# Patient Record
Sex: Female | Born: 1975
Health system: Southern US, Community
[De-identification: ages and names within clinical notes are randomized; demographics above are authoritative.]

## PROBLEM LIST (undated history)

## (undated) DIAGNOSIS — E7800 Pure hypercholesterolemia, unspecified: Secondary | ICD-10-CM

## (undated) DIAGNOSIS — E119 Type 2 diabetes mellitus without complications: Secondary | ICD-10-CM

## (undated) DIAGNOSIS — I10 Essential (primary) hypertension: Secondary | ICD-10-CM

## (undated) HISTORY — PX: TONSILLECTOMY: SUR1361

---

## 1997-03-05 ENCOUNTER — Inpatient Hospital Stay (HOSPITAL_COMMUNITY): Admission: AD | Admit: 1997-03-05 | Discharge: 1997-03-06 | Payer: Self-pay | Admitting: *Deleted

## 1997-06-10 ENCOUNTER — Inpatient Hospital Stay (HOSPITAL_COMMUNITY): Admission: AD | Admit: 1997-06-10 | Discharge: 1997-06-10 | Payer: Self-pay | Admitting: Obstetrics

## 1997-06-17 ENCOUNTER — Other Ambulatory Visit: Admission: RE | Admit: 1997-06-17 | Discharge: 1997-06-17 | Payer: Self-pay | Admitting: Family Medicine

## 1999-07-15 ENCOUNTER — Emergency Department (HOSPITAL_COMMUNITY): Admission: EM | Admit: 1999-07-15 | Discharge: 1999-07-15 | Payer: Self-pay | Admitting: Emergency Medicine

## 1999-07-15 ENCOUNTER — Encounter: Payer: Self-pay | Admitting: Emergency Medicine

## 1999-07-20 ENCOUNTER — Encounter: Admission: RE | Admit: 1999-07-20 | Discharge: 1999-07-29 | Payer: Self-pay | Admitting: Family Medicine

## 2000-06-03 ENCOUNTER — Emergency Department (HOSPITAL_COMMUNITY): Admission: EM | Admit: 2000-06-03 | Discharge: 2000-06-03 | Payer: Self-pay | Admitting: Emergency Medicine

## 2000-09-02 ENCOUNTER — Inpatient Hospital Stay (HOSPITAL_COMMUNITY): Admission: AD | Admit: 2000-09-02 | Discharge: 2000-09-02 | Payer: Self-pay | Admitting: *Deleted

## 2000-12-05 ENCOUNTER — Encounter: Admission: RE | Admit: 2000-12-05 | Discharge: 2001-03-05 | Payer: Self-pay | Admitting: Internal Medicine

## 2001-05-29 ENCOUNTER — Emergency Department (HOSPITAL_COMMUNITY): Admission: EM | Admit: 2001-05-29 | Discharge: 2001-05-29 | Payer: Self-pay

## 2001-07-05 ENCOUNTER — Emergency Department (HOSPITAL_COMMUNITY): Admission: EM | Admit: 2001-07-05 | Discharge: 2001-07-05 | Payer: Self-pay | Admitting: Emergency Medicine

## 2001-07-05 ENCOUNTER — Encounter: Payer: Self-pay | Admitting: Emergency Medicine

## 2001-10-10 ENCOUNTER — Inpatient Hospital Stay (HOSPITAL_COMMUNITY): Admission: AD | Admit: 2001-10-10 | Discharge: 2001-10-10 | Payer: Self-pay | Admitting: Obstetrics and Gynecology

## 2001-10-13 ENCOUNTER — Inpatient Hospital Stay (HOSPITAL_COMMUNITY): Admission: AD | Admit: 2001-10-13 | Discharge: 2001-10-13 | Payer: Self-pay | Admitting: Obstetrics and Gynecology

## 2002-02-24 ENCOUNTER — Inpatient Hospital Stay (HOSPITAL_COMMUNITY): Admission: AD | Admit: 2002-02-24 | Discharge: 2002-02-24 | Payer: Self-pay | Admitting: Obstetrics

## 2002-08-18 ENCOUNTER — Encounter: Admission: RE | Admit: 2002-08-18 | Discharge: 2002-11-16 | Payer: Self-pay | Admitting: Internal Medicine

## 2002-10-27 ENCOUNTER — Inpatient Hospital Stay (HOSPITAL_COMMUNITY): Admission: AD | Admit: 2002-10-27 | Discharge: 2002-10-27 | Payer: Self-pay | Admitting: Obstetrics & Gynecology

## 2003-02-16 ENCOUNTER — Encounter: Admission: RE | Admit: 2003-02-16 | Discharge: 2003-02-16 | Payer: Self-pay | Admitting: Obstetrics

## 2003-02-20 ENCOUNTER — Encounter: Admission: RE | Admit: 2003-02-20 | Discharge: 2003-02-20 | Payer: Self-pay | Admitting: Obstetrics

## 2003-02-20 ENCOUNTER — Ambulatory Visit (HOSPITAL_COMMUNITY): Admission: RE | Admit: 2003-02-20 | Discharge: 2003-02-20 | Payer: Self-pay | Admitting: Obstetrics

## 2003-02-24 ENCOUNTER — Encounter: Admission: RE | Admit: 2003-02-24 | Discharge: 2003-02-24 | Payer: Self-pay | Admitting: Obstetrics

## 2003-02-27 ENCOUNTER — Encounter: Admission: RE | Admit: 2003-02-27 | Discharge: 2003-02-27 | Payer: Self-pay | Admitting: Obstetrics

## 2003-03-02 ENCOUNTER — Ambulatory Visit (HOSPITAL_COMMUNITY): Admission: RE | Admit: 2003-03-02 | Discharge: 2003-03-02 | Payer: Self-pay | Admitting: Obstetrics

## 2003-03-02 ENCOUNTER — Inpatient Hospital Stay (HOSPITAL_COMMUNITY): Admission: AD | Admit: 2003-03-02 | Discharge: 2003-03-06 | Payer: Self-pay | Admitting: Obstetrics

## 2003-03-02 ENCOUNTER — Encounter: Admission: RE | Admit: 2003-03-02 | Discharge: 2003-03-02 | Payer: Self-pay | Admitting: Obstetrics

## 2003-03-03 ENCOUNTER — Encounter (INDEPENDENT_AMBULATORY_CARE_PROVIDER_SITE_OTHER): Payer: Self-pay | Admitting: Specialist

## 2004-09-18 ENCOUNTER — Emergency Department (HOSPITAL_COMMUNITY): Admission: EM | Admit: 2004-09-18 | Discharge: 2004-09-18 | Payer: Self-pay | Admitting: Family Medicine

## 2005-08-06 ENCOUNTER — Inpatient Hospital Stay (HOSPITAL_COMMUNITY): Admission: AD | Admit: 2005-08-06 | Discharge: 2005-08-06 | Payer: Self-pay | Admitting: Obstetrics

## 2005-09-16 ENCOUNTER — Inpatient Hospital Stay (HOSPITAL_COMMUNITY): Admission: AD | Admit: 2005-09-16 | Discharge: 2005-09-16 | Payer: Self-pay | Admitting: Obstetrics

## 2006-01-23 HISTORY — PX: APPENDECTOMY: SHX54

## 2006-12-17 ENCOUNTER — Encounter (INDEPENDENT_AMBULATORY_CARE_PROVIDER_SITE_OTHER): Payer: Self-pay | Admitting: Surgery

## 2006-12-17 ENCOUNTER — Inpatient Hospital Stay (HOSPITAL_COMMUNITY): Admission: EM | Admit: 2006-12-17 | Discharge: 2006-12-18 | Payer: Self-pay | Admitting: Emergency Medicine

## 2007-01-11 ENCOUNTER — Emergency Department (HOSPITAL_COMMUNITY): Admission: EM | Admit: 2007-01-11 | Discharge: 2007-01-11 | Payer: Self-pay | Admitting: Emergency Medicine

## 2008-07-28 ENCOUNTER — Emergency Department (HOSPITAL_COMMUNITY): Admission: EM | Admit: 2008-07-28 | Discharge: 2008-07-28 | Payer: Self-pay | Admitting: Family Medicine

## 2008-08-25 ENCOUNTER — Inpatient Hospital Stay (HOSPITAL_COMMUNITY): Admission: AD | Admit: 2008-08-25 | Discharge: 2008-08-26 | Payer: Self-pay | Admitting: Obstetrics

## 2009-01-07 ENCOUNTER — Encounter: Admission: RE | Admit: 2009-01-07 | Discharge: 2009-01-20 | Payer: Self-pay | Admitting: Obstetrics

## 2009-01-07 ENCOUNTER — Ambulatory Visit (HOSPITAL_COMMUNITY): Admission: RE | Admit: 2009-01-07 | Discharge: 2009-01-07 | Payer: Self-pay | Admitting: Obstetrics

## 2009-01-12 ENCOUNTER — Inpatient Hospital Stay (HOSPITAL_COMMUNITY): Admission: AD | Admit: 2009-01-12 | Discharge: 2009-01-12 | Payer: Self-pay | Admitting: Obstetrics

## 2009-01-28 ENCOUNTER — Ambulatory Visit (HOSPITAL_COMMUNITY): Admission: RE | Admit: 2009-01-28 | Discharge: 2009-01-28 | Payer: Self-pay | Admitting: Obstetrics

## 2009-03-11 ENCOUNTER — Ambulatory Visit (HOSPITAL_COMMUNITY): Admission: RE | Admit: 2009-03-11 | Discharge: 2009-03-11 | Payer: Self-pay | Admitting: Obstetrics

## 2009-04-08 ENCOUNTER — Ambulatory Visit (HOSPITAL_COMMUNITY): Admission: RE | Admit: 2009-04-08 | Discharge: 2009-04-08 | Payer: Self-pay | Admitting: Obstetrics

## 2009-05-06 ENCOUNTER — Ambulatory Visit (HOSPITAL_COMMUNITY): Admission: RE | Admit: 2009-05-06 | Discharge: 2009-05-06 | Payer: Self-pay | Admitting: Obstetrics

## 2009-06-03 ENCOUNTER — Ambulatory Visit (HOSPITAL_COMMUNITY): Admission: RE | Admit: 2009-06-03 | Discharge: 2009-06-03 | Payer: Self-pay | Admitting: Obstetrics

## 2009-06-10 ENCOUNTER — Ambulatory Visit (HOSPITAL_COMMUNITY): Admission: RE | Admit: 2009-06-10 | Discharge: 2009-06-10 | Payer: Self-pay | Admitting: Obstetrics

## 2009-06-17 ENCOUNTER — Ambulatory Visit (HOSPITAL_COMMUNITY): Admission: RE | Admit: 2009-06-17 | Discharge: 2009-06-17 | Payer: Self-pay | Admitting: Obstetrics

## 2009-06-21 ENCOUNTER — Inpatient Hospital Stay (HOSPITAL_COMMUNITY): Admission: AD | Admit: 2009-06-21 | Discharge: 2009-06-21 | Payer: Self-pay | Admitting: Obstetrics

## 2009-06-24 ENCOUNTER — Ambulatory Visit (HOSPITAL_COMMUNITY): Admission: RE | Admit: 2009-06-24 | Discharge: 2009-06-24 | Payer: Self-pay | Admitting: Obstetrics

## 2009-06-28 ENCOUNTER — Ambulatory Visit (HOSPITAL_COMMUNITY): Admission: RE | Admit: 2009-06-28 | Discharge: 2009-06-28 | Payer: Self-pay | Admitting: Obstetrics

## 2009-07-01 ENCOUNTER — Ambulatory Visit (HOSPITAL_COMMUNITY): Admission: RE | Admit: 2009-07-01 | Discharge: 2009-07-01 | Payer: Self-pay | Admitting: Obstetrics

## 2009-07-05 ENCOUNTER — Ambulatory Visit (HOSPITAL_COMMUNITY): Admission: RE | Admit: 2009-07-05 | Discharge: 2009-07-05 | Payer: Self-pay | Admitting: Obstetrics

## 2009-07-08 ENCOUNTER — Ambulatory Visit (HOSPITAL_COMMUNITY): Admission: RE | Admit: 2009-07-08 | Discharge: 2009-07-08 | Payer: Self-pay | Admitting: Obstetrics

## 2009-07-12 ENCOUNTER — Ambulatory Visit (HOSPITAL_COMMUNITY): Admission: RE | Admit: 2009-07-12 | Discharge: 2009-07-12 | Payer: Self-pay | Admitting: Obstetrics

## 2009-07-15 ENCOUNTER — Ambulatory Visit (HOSPITAL_COMMUNITY): Admission: RE | Admit: 2009-07-15 | Discharge: 2009-07-15 | Payer: Self-pay | Admitting: Obstetrics

## 2009-07-19 ENCOUNTER — Ambulatory Visit (HOSPITAL_COMMUNITY): Admission: RE | Admit: 2009-07-19 | Discharge: 2009-07-19 | Payer: Self-pay | Admitting: Obstetrics

## 2009-07-22 ENCOUNTER — Ambulatory Visit (HOSPITAL_COMMUNITY): Admission: RE | Admit: 2009-07-22 | Discharge: 2009-07-22 | Payer: Self-pay | Admitting: Obstetrics

## 2009-07-28 ENCOUNTER — Inpatient Hospital Stay (HOSPITAL_COMMUNITY): Admission: RE | Admit: 2009-07-28 | Discharge: 2009-07-31 | Payer: Self-pay | Admitting: Obstetrics

## 2009-08-19 ENCOUNTER — Emergency Department (HOSPITAL_COMMUNITY): Admission: EM | Admit: 2009-08-19 | Discharge: 2009-08-19 | Payer: Self-pay | Admitting: Emergency Medicine

## 2010-04-10 LAB — RPR: RPR Ser Ql: NONREACTIVE

## 2010-04-10 LAB — CBC
HCT: 27.9 % — ABNORMAL LOW (ref 36.0–46.0)
HCT: 35.7 % — ABNORMAL LOW (ref 36.0–46.0)
Hemoglobin: 9.4 g/dL — ABNORMAL LOW (ref 12.0–15.0)
Hemoglobin: 11.9 g/dL — ABNORMAL LOW (ref 12.0–15.0)
MCHC: 33.2 g/dL (ref 30.0–36.0)
MCV: 83 fL (ref 78.0–100.0)
RBC: 3.36 MIL/uL — ABNORMAL LOW (ref 3.87–5.11)
RDW: 15.2 % (ref 11.5–15.5)
WBC: 16 10*3/uL — ABNORMAL HIGH (ref 4.0–10.5)
WBC: 10.8 10*3/uL — ABNORMAL HIGH (ref 4.0–10.5)

## 2010-04-10 LAB — BASIC METABOLIC PANEL
BUN: 6 mg/dL (ref 6–23)
CO2: 23 mEq/L (ref 19–32)
Chloride: 107 mEq/L (ref 96–112)
Potassium: 3.9 mEq/L (ref 3.5–5.1)

## 2010-04-10 LAB — GLUCOSE, CAPILLARY
Glucose-Capillary: 109 mg/dL — ABNORMAL HIGH (ref 70–99)
Glucose-Capillary: 174 mg/dL — ABNORMAL HIGH (ref 70–99)
Glucose-Capillary: 67 mg/dL — ABNORMAL LOW (ref 70–99)
Glucose-Capillary: 70 mg/dL (ref 70–99)
Glucose-Capillary: 78 mg/dL (ref 70–99)
Glucose-Capillary: 79 mg/dL (ref 70–99)
Glucose-Capillary: 88 mg/dL (ref 70–99)
Glucose-Capillary: 91 mg/dL (ref 70–99)
Glucose-Capillary: 92 mg/dL (ref 70–99)

## 2010-04-10 LAB — SURGICAL PCR SCREEN: MRSA, PCR: NEGATIVE

## 2010-04-25 LAB — CBC
HCT: 37.9 % (ref 36.0–46.0)
Hemoglobin: 12.4 g/dL (ref 12.0–15.0)
MCHC: 32.7 g/dL (ref 30.0–36.0)
RDW: 13.3 % (ref 11.5–15.5)

## 2010-04-25 LAB — URINALYSIS, ROUTINE W REFLEX MICROSCOPIC
Glucose, UA: >1000 mg/dL — AB
Ketones, ur: NEGATIVE mg/dL
pH: 6 (ref 5.0–8.0)

## 2010-04-25 LAB — GC/CHLAMYDIA PROBE AMP, GENITAL
Chlamydia, DNA Probe: NEGATIVE
GC Probe Amp, Genital: NEGATIVE

## 2010-04-25 LAB — COMPREHENSIVE METABOLIC PANEL
Alkaline Phosphatase: 48 U/L (ref 39–117)
BUN: 6 mg/dL (ref 6–23)
Calcium: 9.4 mg/dL (ref 8.4–10.5)
Creatinine, Ser: 0.56 mg/dL (ref 0.4–1.2)
Glucose, Bld: 134 mg/dL — ABNORMAL HIGH (ref 70–99)
Total Protein: 6.6 g/dL (ref 6.0–8.3)

## 2010-04-25 LAB — URINE MICROSCOPIC-ADD ON

## 2010-04-30 LAB — URINE CULTURE: Colony Count: NO GROWTH

## 2010-04-30 LAB — COMPREHENSIVE METABOLIC PANEL
BUN: 9 mg/dL (ref 6–23)
Calcium: 9.5 mg/dL (ref 8.4–10.5)
Creatinine, Ser: 0.8 mg/dL (ref 0.4–1.2)
Glucose, Bld: 161 mg/dL — ABNORMAL HIGH (ref 70–99)
Sodium: 137 mEq/L (ref 135–145)
Total Protein: 7.1 g/dL (ref 6.0–8.3)

## 2010-04-30 LAB — URINALYSIS, ROUTINE W REFLEX MICROSCOPIC
Glucose, UA: 250 mg/dL — AB
Protein, ur: NEGATIVE mg/dL
pH: 5.5 (ref 5.0–8.0)

## 2010-04-30 LAB — WET PREP, GENITAL
Trich, Wet Prep: NONE SEEN
Yeast Wet Prep HPF POC: NONE SEEN

## 2010-04-30 LAB — CBC
HCT: 40.7 % (ref 36.0–46.0)
Hemoglobin: 13.5 g/dL (ref 12.0–15.0)
MCHC: 33.1 g/dL (ref 30.0–36.0)
MCV: 86.7 fL (ref 78.0–100.0)
RDW: 13.3 % (ref 11.5–15.5)

## 2010-04-30 LAB — GC/CHLAMYDIA PROBE AMP, GENITAL: GC Probe Amp, Genital: NEGATIVE

## 2010-04-30 LAB — URINE MICROSCOPIC-ADD ON

## 2010-06-07 NOTE — Op Note (Signed)
Traci Butler, Traci Butler              ACCOUNT NO.:  [id number]   MEDICAL RECORD NO.:  [id number]          PATIENT TYPE:  INP   LOCATION:  1610                         FACILITY:  [facility]   PHYSICIAN:  Velora Heckler, MD      DATE OF BIRTH:  Sep 26, 1975   DATE OF PROCEDURE:  12/17/2006  DATE OF DISCHARGE:                               OPERATIVE REPORT   PREOPERATIVE DIAGNOSIS:  Acute appendicitis.   POSTOPERATIVE DIAGNOSIS:  Acute appendicitis.   PROCEDURE:  Laparoscopic appendectomy.   SURGEON:  Velora Heckler, MD, FACS   ANESTHESIA:  General per Dr. Lestine Box.   ESTIMATED BLOOD LOSS:  Minimal.   PREPARATION:  Betadine.   COMPLICATIONS:  None.   INDICATIONS:  The patient is a 35 year old black female presents to the  emergency department with a 24-hour history of abdominal pain, diarrhea,  and nausea.  White blood cell count is elevated at 19,100 with a left  shift.  CT scan abdomen and pelvis demonstrated findings consistent with  acute appendicitis.  The patient is seen and evaluated in the emergency  department and prepared for the operating room.   BODY OF REPORT:  Procedure was done in OR #1 at the [facility].  The patient is brought to the operating room,  placed in supine position on the operating room table.  Following  administration of general anesthesia, the patient is prepped and draped  in usual strict aseptic fashion.  After ascertaining that an adequate  level of anesthesia been obtained, a supraumbilical incision was made a  #15 blade.  Dissection was carried down to the fascia.  Fascia is  incised in the midline.  The peritoneal cavity is entered cautiously.  0  Vicryl pursestring suture was placed in the fascia.  A Hasson cannula  was introduced under direct vision and secured with a pursestring  suture.  Abdomen was insufflated carbon dioxide.  Laparoscope was  introduced in the abdomen explored.  There are moderate adhesions in the  mid and lower abdomen.  Operative port is placed in the right upper  quadrant.  Using the harmonic scalpel, adhesions were taken down.  A  second operative port was placed in the left lower quadrant.  After  taking down adhesions, the cecum was identified.  Base of the appendix  was identified.  The appendix was then mobilized from its  retroperitoneal adhesions.  Appendix is quite large, distended but no  sign of gross perforation.  Mesoappendix was divided with the harmonic  scalpel with good hemostasis achieved.  Dissection is carried down to  the base of the appendix which is cleared circumferentially.  Using an  Endo-GIA stapler with a regular cartridge, the base of the appendix is  transected at its junction with the cecal wall.  Appendix is placed into  an EndoCatch bag.  Right lower quadrant was irrigated with warm saline.  Good hemostasis is noted at the staple line and along the divided  mesoappendix.  Fluid is aspirated from the pelvis.  Appendix was  withdrawn through the umbilical port with removal of the  Hasson cannula.  0 Vicryl pursestring sutures tied securely.  Other ports were removed  under direct vision and good hemostasis was noted.  Port sites were  anesthetized with local anesthetic.  Skin incisions were closed with interrupted 4-0 Vicryl subcuticular  sutures.  Wounds washed and dried and Benzoin and Steri-Strips were  applied.  Sterile dressings were applied.  The patient is awakened from  anesthesia and brought to the recovery room in stable condition.  The  patient tolerated the procedure well.      Velora Heckler, MD  Electronically Signed     TMG/MEDQ  D:  12/17/2006  T:  12/17/2006  Job:  [id number]   cc:   Dione Booze, MD  [address] [place], [place] [address]

## 2010-06-07 NOTE — H&P (Signed)
Traci Butler, Traci Butler NO.:  [id number]   MEDICAL RECORD NO.:  [id number]          PATIENT TYPE:  EMS   LOCATION:  ED                           FACILITY:  [facility]   PHYSICIAN:  Velora Heckler, MD      DATE OF BIRTH:  08-21-1975   DATE OF ADMISSION:  12/17/2006  DATE OF DISCHARGE:                              HISTORY & PHYSICAL   REFERRING PHYSICIAN:  Dr. Preston Fleeting, Emergency Department, [facility].   CHIEF COMPLAINT:  Abdominal pain, diarrhea.   HISTORY OF PRESENT ILLNESS:  The patient is a 35 year old black female  who presents to the emergency department with a 24-hour history of  abdominal pain which is poorly localized.  She has had an episode of  diarrhea.  She has had nausea.  In the emergency department, laboratory  studies showed an elevated white blood cell count of 19,100.  There is a  left shift.  CT scan of abdomen and pelvis was obtained with findings  consistent with acute appendicitis.  General Surgery is called for  management.   PAST MEDICAL HISTORY:  1. History of cesarean section.  2. Status post umbilical herniorrhaphy.  3. History of diabetes mellitus, untreated.   MEDICATIONS:  None.   ALLERGIES:  None known.   SOCIAL HISTORY:  The patient is single.  She has 2 children.  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  She smokes less than a pack of  cigarettes a day.  She drinks alcohol on occasion.  She is accompanied  by her mother.   FAMILY HISTORY:  Negative per the patient's mother.   REVIEW OF SYSTEMS:  Fifteen system review without significant other  findings except as noted above.   EXAM:  GENERAL:  A 35 year old moderately obese black female in mild  discomfort on a stretcher in the emergency department.  VITAL SIGNS:  Temperature 97.7, pulse 70, respirations 20, blood  pressure 130/80.  HEENT:  Shows her to be normocephalic, atraumatic.  Sclerae are clear.  Conjunctiva clear.  Pupils are equal and  reactive.  Dentition fair.  Mucous membranes moist.  Voice normal.  NECK:  Palpation of the neck shows no thyroid nodularity, no  lymphadenopathy, no tenderness, no mass.  LUNGS:  Clear to auscultation bilaterally without rales, rhonchi or  wheeze.  CARDIAC:  Exam shows regular rate and rhythm without murmur.  Peripheral  pulses are full.  EXTREMITIES:  Nontender without edema.  ABDOMEN:  Examination of the abdomen shows it to be soft, obese with a  well-healed surgical wound at the base of the umbilicus.  There are  bowel sounds present.  There is tenderness to palpation, particularly in  the right lower quadrant.  There is mild guarding.  There is mild  rebound tenderness in the right lower quadrant.  There is a well-healed  Pfannenstiel incision.  NEUROLOGICAL:  The patient is alert and  oriented.  There is no sign of tremor.   LABORATORY STUDIES:  White count 19.1, hemoglobin 13.4, hematocrit  40.3%, platelet count of 483,000; differential shows 79% neutrophils,  17% lymphocytes, 3% monocytes.  Electrolytes are normal.  Glucose  elevated at 185.  Urine pregnancy test negative.  Urinalysis positive  for nitrite with 7-10 white cells per high-powered field and many  bacteria consistent with urinary tract infection.   RADIOGRAPHIC STUDIES:  CT scan of abdomen and pelvis showing findings  consistent with acute appendicitis.   IMPRESSION:  Acute appendicitis.   PLAN:  1. Admission to [facility].  2. Initiation of intravenous antibiotics.  3. To operating room for laparoscopic appendectomy.  4. Routine postoperative care.   I discussed with the patient and her mother the indications for surgery.  I explained laparoscopic appendectomy.  I explained that we would  attempt to remove the appendix through 3 separate small incisions.  I  explained that she would need to remain in the hospital for intravenous  antibiotics and pain control postoperatively for 1-2  days.  She  understands and wishes to proceed.  We will make arrangements for that  with the operating room for surgery immediately.      Velora Heckler, MD  Electronically Signed     TMG/MEDQ  D:  12/17/2006  T:  12/17/2006  Job:  [id number] [id number]

## 2010-06-10 NOTE — Op Note (Signed)
Traci Butler, Traci Butler                          ACCOUNT NO.:  [id number]   MEDICAL RECORD NO.:  [id number]                   PATIENT TYPE:  INP   LOCATION:  9118                                 FACILITY:  [facility]   PHYSICIAN:  Kathreen Cosier, M.D.           DATE OF BIRTH:  1975-09-06   DATE OF PROCEDURE:  03/03/2003  DATE OF DISCHARGE:                                 OPERATIVE REPORT   PREOPERATIVE DIAGNOSIS:  Intrauterine pregnancy at 38 weeks in a diabetic  with a mature fetus and nonreassuring fetal heart rate tracing.   SURGEON:  Kathreen Cosier, M.D.   FIRST ASSISTANT:  Charles A. Clearance Coots, M.D.   ANESTHESIA:  Spinal.   DESCRIPTION OF PROCEDURE:  Patient placed on the operating room table in  supine position after the spinal anesthesia, the abdomen prepped and draped,  bladder emptied with a Foley catheter.  A transverse suprapubic incision  made, carried down to the rectus fascia, and the fascia cleaned and incised  the length of the incision, the recti muscles retracted laterally,  peritoneum incised longitudinally.  A transverse incision made in the  visceral peritoneum above the bladder and the bladder mobilized inferiorly.  A transverse lower uterine incision was made with the patient and delivered  with a vacuum from the OP position of a female, Apgar 9 and 9, weighing 6  pounds 7 ounces.  The fluid was clear, and the team was in attendance.  The  placenta was anterior and removed manually.  The uterine cavity cleaned with  dry laps.  The uterine incision closed in one layer with continuous suture  of #1 chromic.  Hemostasis was satisfactory.  The bladder flap reattached  with 2-0 chromic.  The uterus well-contracted, tubes and ovaries normal.  Abdomen closed in layers, the peritoneum with a continuous suture of 0  chromic, fascia with continuous suture of 0 Dexon.  Skin closed with  subcuticular stitch of 3-0 Monocryl.  Blood loss was 400 mL.                             Kathreen Cosier, M.D.    BAM/MEDQ  D:  03/03/2003  T:  03/03/2003  Job:  [id number]

## 2010-06-10 NOTE — H&P (Signed)
[name], Traci Butler                          ACCOUNT NO.:  [id number]   MEDICAL RECORD NO.:  [id number]                   PATIENT TYPE:  INP   LOCATION:  9118                                 FACILITY:  [facility]   PHYSICIAN:  Kathreen Cosier, M.D.           DATE OF BIRTH:  [birth year]   DATE OF ADMISSION:  03/02/2003  DATE OF DISCHARGE:                                HISTORY & PHYSICAL   REASON FOR ADMISSION:  The patient is a 35 year old gravida 3, para 1-0-1-1,  [facility] March 15, 2003.  She is now at 38 weeks.  She is a gestational  diabetic on 50 units of Novolog in a.m. and at lunchtime and 80 units at  p.m.  She is followed by Dr. Margaretmary Bayley.  The patient has also been  followed with a nonstress test twice weekly and ultrasounds every 3 weeks  and her blood sugars have tended to be normal.  Amniocentesis was performed  on March 02, 2003.  L/S was 4.321 ________.  She was brought in for  induction of labor.   PHYSICAL EXAMINATION:  GENERAL:  A 327-pound female in no distress.  HEENT:  Negative.  LUNGS:  Clear.  HEART:  Regular rhythm.  No murmurs, no gallops.  ABDOMEN:  Term.  Cervix was long and closed and the vertex minus 3.   On admission, she had decided to take the 25 mcg __________ and the patient  developed fetal tachycardia.  By 2:40 in the morning had decelerations which  kept getting worse and it was decided she would deliver by C-section because  of the nonreassuring fetal heart rate tracing at 38-weeks in a diabetic  brought in for induction.  CBG while in the hospital was 210 and 220 and she  was covered with regular insulin. Physical exam revealed no other  abnormalities.                                               Kathreen Cosier, M.D.    BAM/MEDQ  D:  03/03/2003  T:  03/03/2003  Job:  [id number]

## 2010-06-10 NOTE — Discharge Summary (Signed)
[name], Traci Butler                          ACCOUNT NO.:  [id number]   MEDICAL RECORD NO.:  [id number]                   PATIENT TYPE:  INP   LOCATION:  9118                                 FACILITY:  [facility]   PHYSICIAN:  Roseanna Rainbow, M.D.         DATE OF BIRTH:  August 16, 1975   DATE OF ADMISSION:  03/02/2003  DATE OF DISCHARGE:  03/06/2003                                 DISCHARGE SUMMARY   CHIEF COMPLAINT:  The patient is a 35 year old gravida 3 para 1 with  estimated date of confinement of March 15, 2003 with an intrauterine  pregnancy at 38 weeks.  She is an insulin-requiring gestational diabetic now  status post amniocentesis with an L/S ratio of 4:1 and PG.  She presents for  induction of labor.  Please see the dictated History and Physical as per Dr.  Francoise Ceo for further details.   HOSPITAL COURSE:  The patient was admitted.  Attempts were made to ripen the  cervix with prostaglandin.  The fetal heart tracing developed tachycardia  with decelerations and she was brought for cesarean delivery.  Please see  the dictated operative summary for further details.  Her postoperative  course was uneventful.  She remained euglycemic.  She received Depo-Provera  prior to discharge for contraception.  She is discharged to home on  postoperative day #3 tolerating  a regular diet.   DISCHARGE DIAGNOSES:  1. Intrauterine pregnancy at term.  2. Insulin-requiring gestational diabetes.  3. Suspicious fetal heart tracing with fetal tachycardia and decelerations.   PROCEDURE:  Cesarean delivery.   CONDITION:  Good.   DIET:  Regular.   ACTIVITY:  Pelvic rest, no strenuous activity.   MEDICATIONS:  Percocet one to two tablets q.6h. as needed.   DISPOSITION:  The patient was to follow up with Dr. Gaynell Face and with Dr.  Margaretmary Bayley.                                               Roseanna Rainbow, M.D.    [name]  D:  03/31/2003  T:  03/31/2003  Job:  [id number]

## 2010-08-04 ENCOUNTER — Inpatient Hospital Stay (INDEPENDENT_AMBULATORY_CARE_PROVIDER_SITE_OTHER)
Admission: RE | Admit: 2010-08-04 | Discharge: 2010-08-04 | Disposition: A | Discharge status: Home / Self Care | Payer: Self-pay | Source: Ambulatory Visit | Attending: Family Medicine | Admitting: Family Medicine

## 2010-08-04 DIAGNOSIS — H109 Unspecified conjunctivitis: Secondary | ICD-10-CM

## 2010-09-21 ENCOUNTER — Telehealth: Payer: Self-pay | Admitting: *Deleted

## 2010-09-21 ENCOUNTER — Encounter: Payer: Self-pay | Admitting: Internal Medicine

## 2010-09-21 ENCOUNTER — Ambulatory Visit (INDEPENDENT_AMBULATORY_CARE_PROVIDER_SITE_OTHER): Payer: Medicaid Other | Admitting: Internal Medicine

## 2010-09-21 DIAGNOSIS — Z309 Encounter for contraceptive management, unspecified: Secondary | ICD-10-CM

## 2010-09-21 DIAGNOSIS — Z720 Tobacco use: Secondary | ICD-10-CM

## 2010-09-21 DIAGNOSIS — IMO0001 Reserved for inherently not codable concepts without codable children: Secondary | ICD-10-CM

## 2010-09-21 DIAGNOSIS — E119 Type 2 diabetes mellitus without complications: Secondary | ICD-10-CM

## 2010-09-21 DIAGNOSIS — Z79899 Other long term (current) drug therapy: Secondary | ICD-10-CM

## 2010-09-21 DIAGNOSIS — E1169 Type 2 diabetes mellitus with other specified complication: Secondary | ICD-10-CM | POA: Insufficient documentation

## 2010-09-21 DIAGNOSIS — Z23 Encounter for immunization: Secondary | ICD-10-CM

## 2010-09-21 DIAGNOSIS — F17200 Nicotine dependence, unspecified, uncomplicated: Secondary | ICD-10-CM

## 2010-09-21 LAB — POCT GLYCOSYLATED HEMOGLOBIN (HGB A1C): Hemoglobin A1C: 9.5

## 2010-09-21 LAB — LIPID PANEL
LDL Cholesterol: 83 mg/dL (ref 0–99)
VLDL: 34 mg/dL (ref 0–40)

## 2010-09-21 LAB — GLUCOSE, CAPILLARY: Glucose-Capillary: 245 mg/dL — ABNORMAL HIGH (ref 70–99)

## 2010-09-21 LAB — POCT URINE PREGNANCY: Preg Test, Ur: NEGATIVE

## 2010-09-21 MED ORDER — ACCU-CHEK FASTCLIX LANCETS MISC: 1.0000 | Status: DC

## 2010-09-21 MED ORDER — ACCU-CHEK NANO SMARTVIEW W/DEVICE KIT: 1.0000 | PACK | Freq: Once | Status: DC

## 2010-09-21 MED ORDER — GLIMEPIRIDE 4 MG PO TABS: 4.0000 mg | ORAL_TABLET | Freq: Every day | ORAL | Status: DC

## 2010-09-21 MED ORDER — METFORMIN HCL 500 MG PO TABS: 500.0000 mg | ORAL_TABLET | Freq: Two times a day (BID) | ORAL | Status: DC

## 2010-09-21 MED ORDER — MEDROXYPROGESTERONE ACETATE 150 MG/ML IM SUSP: 150.0000 mg | Freq: Once | INTRAMUSCULAR | Status: DC

## 2010-09-21 MED ORDER — GLUCOSE BLOOD VI STRP: ORAL_STRIP | Status: DC

## 2010-09-21 NOTE — Assessment & Plan Note (Signed)
Patient has been off medication for approximately one year. She has been treated during 2 pregnancies with insulin. She was taking metformin and glimepiride for a short time after her most recent pregnancy in 2011, however she ran out of medication one year ago. She is a current smoker. We did check a hemoglobin A1c at today's visit, we did do a foot exam at today's visit, we did do a capillary blood glucose at today's visit, we will consider referring her for eye exam at future visit. She states she has had her eyes checked in the past year. We will refill metformin and glimepiride at today's visit. We did prescribe her meter and instructed her to check her blood glucose levels 3-5 times a week. We did advise her to bring her meter to every visit. We did give her a pneumonia vaccine today. We checked a urine microalbumin to creatinine ratio at today's visit. We also checked a fasting lipid panel at today's visit. She will come back in one month for close followup and for review of her meter. She does not wish to quit smoking at today's visit.

## 2010-09-21 NOTE — Progress Notes (Signed)
Subjective:    Patient ID: Traci Butler, female    DOB: 05/26/1975, 35 y.o.   MRN: [id number]  HPI: The patient is a 35 year old female who is new to our clinic today. She has a past medical history significant for diabetes. She was on insulin during the last 2 of her 3 pregnancies. She does have an 34 year old, 35-year-old, 64-month-old. 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 The patient is actually going back to school soon at [employer]. She has also recently taken in her niece that is one-month-old because it is her brother's child and the mother is unable to keep the child she has had problems with past children not being cared for. She is currently smoking about half pack per day. She does not wish to quit at this time. She is under a lot of stress at home and use this to help her cope. She has been out of her diabetes medication for approximately one year. She has not seen a doctor since her pregnancy. She did use marijuana in the past however has not using it now. Her last mental period was on 09/22/2010. She is sexually active with one partner. Last Pap was in 2011. She is not having any acute complaints at today's visit. No chest pain, shortness of breath, fevers, chills, change in bowel habits.  Allergies: No known drug allergies  Past medical history: Diabetes Tobacco abuse  Family medical history: Mother had high cholesterol, hypertension, diabetes  Social history: Patient is going back to school soon at [employer], she has 4 children living at home. One of them is her niece, who is one-month-old. She does not wish to have any more children at this time  and would like to go on Depo-Provera. She has been on in the past.  Review of Systems  Constitutional: Negative.  Negative for fever, chills, activity change, appetite change, fatigue and unexpected weight change.  HENT: Negative.   Eyes: Negative.   Respiratory:  Negative.  Negative for cough, choking, chest tightness and shortness of breath.   Cardiovascular: Negative.  Negative for chest pain, palpitations and leg swelling.  Gastrointestinal: Negative.  Negative for nausea, vomiting, abdominal pain, diarrhea, constipation and blood in stool.  Musculoskeletal: Negative.  Negative for myalgias, back pain, joint swelling, arthralgias and gait problem.  Skin: Negative.   Neurological: Negative.   Hematological: Negative.   Psychiatric/Behavioral: Negative.     Vitals: Blood pressure: 144/97 Pulse: 79 Temperature: 98.40F Oxygen saturation: 98% on room air Height 5 foot 6 inches Weight 279 pounds    Objective:   Physical Exam  Constitutional: She is oriented to person, place, and time. She appears well-developed and well-nourished.  HENT:  Head: Normocephalic and atraumatic.  Eyes: EOM are normal. Pupils are equal, round, and reactive to light.  Neck: Normal range of motion. Neck supple. No tracheal deviation present. No thyromegaly present.  Cardiovascular: Normal rate and regular rhythm.   Pulmonary/Chest: Effort normal and breath sounds normal. No respiratory distress. She has no wheezes. She has no rales. She exhibits no tenderness.  Abdominal: Soft. Bowel sounds are normal. She exhibits no distension. There is no tenderness. There is no rebound and no guarding.  Musculoskeletal: Normal range of motion.  Lymphadenopathy:    She has no cervical adenopathy.  Neurological: She is alert and oriented to person, place, and time.  Skin: Skin is warm and dry. No rash noted. No erythema. No  pallor.  Psychiatric: She has a normal mood and affect. Her behavior is normal. Judgment and thought content normal.          Assessment & Plan:  1. Disposition-patient did take urine pregnancy today, she is instructed to return when she gets her period so that she can take another pregnancy test and receive her Depo-Provera at that time. We did draw  hemoglobin A1c, blood glucose, urine for microglial rent to creatinine ratio. We did do a foot exam. We did give her a tetanus diphtheria pertussis, and pneumonia vaccine today. The tdap is especially indicated as she does have a child that is one-month-old in the home. We also drew a fasting lipid panel today. We will call her if there are any problems with any of her results. I did re\re prescribe metformin 500 mg twice daily and Amaryl 4 mg daily for her diabetes. I also prescribed her a meter at today's visit. I instructed her to check her sugars 3-5 times per week. I advised her to bring her meter to every visit. We will see her back in one month to check on her diabetes.  2. Please see problem-oriented charting.

## 2010-09-21 NOTE — Progress Notes (Signed)
Ms. Christians history and physical examination were reviewed with Dr. Dorise Hiss and we formulated the assessment and plan together.

## 2010-09-21 NOTE — Patient Instructions (Signed)
You were seen today as a new patient. Your doctor's name is Dr. Dorise Hiss. We are restarting your medications today. Take metformin at dinnertime for 1 week. Then take 1 pill with breakfast and 1 pill with dinner until you come back. Take amaryl once a day. We are going to give you a depo-provera shot for birth control. Please come back to our office when you get your period. At that time, we will re-check a pregnancy test and if it is negative we will give you the shot then. We are checking some labs today. We will call you if there is a problem with the results. Please come back in 1 month to discuss how your diabetes is going. We are prescribing you a meter today. Please check your blood sugars 3-5 times per week so we can watch your sugars. Please bring your meter with you to every visit. We gave you a pneumonia shot today and a tetanus shot today. Think about getting a flu shot in October. If you have any questions or problems you can always call our office at any time. Our number is [phone]. We will see you back in 1 month. Think about quitting smoking or cutting back on the number you are smoking!  Diabetes, Frequently Asked Questions WHAT IS DIABETES? Most of the food we eat is turned into glucose (sugar). Our bodies use it for energy. The pancreas makes a hormone called insulin. It helps glucose get into the cells of our bodies. When you have diabetes, your body either does not make enough insulin or cannot use its own insulin as well as it should. This causes sugars to build up in your blood. WHAT ARE THE SYMPTOMS OF DIABETES?  Frequent urination.   Excessive thirst.   Unexplained weight loss.   Extreme hunger.   Blurred vision.   Tingling or numbness in hands or feet.   Feeling very tired much of the time.   Dry, itchy skin.   Sores that are slow to heal.   Yeast infections.   WHAT ARE THE TYPES OF DIABETES? Type 1 Diabetes   About 10% of affected people have this type.    Usually occurs before the age of 42.   Usually occurs in thin to normal weight people.  Type 2 Diabetes  About 90% of affected people have this type.   Usually occurs after the age of 9.   Usually occurs in overweight people.   More likely to have:   A family history of diabetes.   A history of diabetes during pregnancy (gestational diabetes).   High blood pressure.   High cholesterol and triglycerides.  Gestational Diabetes  Occurs in about 4% of pregnancies.   Usually goes away after the baby is born.   More likely to occur in women with:   Family history of diabetes.   Previous gestational diabetes.   Obese.   Over 2 years old.  WHAT IS PRE-DIABETES? Pre-diabetes means your blood glucose is higher than normal, but lower than the diabetes range. It also means you are at risk of getting type 2 diabetes and heart disease. If you are told you have pre-diabetes, have your blood glucose checked again in 1 to 2 years. WHAT IS THE TREATMENT FOR DIABETES? Treatment is aimed at keeping blood glucose near normal levels at all times. Learning how to manage this yourself is important in treating diabetes. Depending on the type of diabetes you have, your treatment will include one or more of  the following:  Monitoring your blood glucose.   Meal planning.   Exercise.   Oral medicine (pills) or insulin.  CAN DIABETES BE PREVENTED? With type 1 diabetes, prevention is more difficult, because the triggers that cause it are not yet known. With type 2 diabetes, prevention is more likely, with lifestyle changes:  Maintain a healthy weight.   Eat healthy.   Exercise.  IS THERE A CURE FOR DIABETES? No, there is no cure for diabetes. There is a lot of research going on that is looking for a cure, and progress is being made. Diabetes can be treated and controlled. People with diabetes can manage their diabetes and lead normal, active lives. SHOULD I BE TESTED FOR  DIABETES? If you are at least 34 years old, you should be tested for diabetes. You should be tested again every 3 years. If you are 45 or older and overweight, you may want to get tested more often. If you are younger than 45, overweight, and have one or more of the following risk factors, you should be tested:  Family history of diabetes.   Inactive lifestyle.   High blood pressure.  WHAT ARE SOME OTHER SOURCES FOR INFORMATION ON DIABETES? The following organizations may help in your search for more information on diabetes: National Diabetes Education Program (NDEP) Internet: [employer] Telephone toll free: (520) 516-4155 American Diabetes Association Telephone: (662)660-4268 To order publications toll free: 1-800-ADA-ORDER  Diabetes information: 1-743 240 3283 (800-DIABETES)  Internet: http://www.diabetes.org  Juvenile Diabetes [employer] International Telephone: 301-020-2973 Internet: [employer] Document Released: 01/12/2003 Document Re-Released: 12/28/2008 [facility] Patient Information 2011 [place], [place].

## 2010-09-21 NOTE — Telephone Encounter (Signed)
Pt called.  [facility] pharmacy stated her Medicaid will not cover the rxs given to her today (only cover birth control pills per pt according to the pharmacy).  But she said she will look into this.  I did call her rxs to [facility] on [address]; Metformin and Amaryl are on the $4 list.  I told the pt to let us know if she has difficulty getting the meter and she said she would.

## 2010-09-21 NOTE — Assessment & Plan Note (Signed)
Patient has had 3 pregnancies, she states she has been on Depo-Provera in the past. She is not on any birth control currently. She is sexually active with one partner. She would like to be hemorrhage control at this time. We did check a urine pregnancy at today's visit. Assuming it is negative she will come back in approximately one to 2 weeks when her period comes for a recheck of the urine pregnancy. If that is also negative we will administer Depo-Provera at that time. I have written the order for the urine pregnancy and for the Depo-Provera.

## 2010-09-21 NOTE — Assessment & Plan Note (Signed)
Patient does smoke approximately half pack per day. She states that increased stress makes her smoke more. She has had a lot of stress recently. She recently acquired her niece who is 68-month-old and brought her home with her. She does have 2 children under the age of 2 in the household. She also has a 34-year-old and an 34 year old. Please re-counsel at future visits.

## 2010-09-21 NOTE — Telephone Encounter (Signed)
Thanks, let me know if there are further problems.

## 2010-09-22 ENCOUNTER — Ambulatory Visit: Payer: Medicaid Other | Admitting: *Deleted

## 2010-09-22 DIAGNOSIS — Z30011 Encounter for initial prescription of contraceptive pills: Secondary | ICD-10-CM

## 2010-09-22 DIAGNOSIS — Z309 Encounter for contraceptive management, unspecified: Secondary | ICD-10-CM

## 2010-09-22 LAB — MICROALBUMIN / CREATININE URINE RATIO
Creatinine, Urine: 171.6 mg/dL
Microalb, Ur: 2.01 mg/dL — ABNORMAL HIGH (ref 0.00–1.89)

## 2010-09-27 MED ORDER — MEDROXYPROGESTERONE ACETATE 150 MG/ML IM SUSP: 150.0000 mg | Freq: Once | INTRAMUSCULAR | Status: DC

## 2010-10-21 ENCOUNTER — Encounter: Payer: Self-pay | Admitting: Internal Medicine

## 2010-10-21 ENCOUNTER — Ambulatory Visit (INDEPENDENT_AMBULATORY_CARE_PROVIDER_SITE_OTHER): Payer: Medicaid Other | Admitting: Internal Medicine

## 2010-10-21 VITALS — BP 152/100 | HR 81 | Temp 98.8°F | Ht 66.0 in | Wt 281.7 lb

## 2010-10-21 DIAGNOSIS — E119 Type 2 diabetes mellitus without complications: Secondary | ICD-10-CM

## 2010-10-21 DIAGNOSIS — Z23 Encounter for immunization: Secondary | ICD-10-CM

## 2010-10-21 LAB — GLUCOSE, CAPILLARY: Glucose-Capillary: 142 mg/dL — ABNORMAL HIGH (ref 70–99)

## 2010-10-21 MED ORDER — GLUCOSE BLOOD VI STRP: ORAL_STRIP | Status: DC

## 2010-10-21 NOTE — Patient Instructions (Addendum)
Please make a followup appointment in around December first week-to check hemoglobin A1c. Please keep on taking metformin and Amaryl at same dose. Please check your sugars at least 4-5 times a week. Please bring your meter at all visits along with you pills. Meanwhile if anything comes up please call the clinic or go to the ER  Also take Benadryl 25 mg one tablet up to 3 times a day for itching.

## 2010-10-21 NOTE — Progress Notes (Signed)
  Subjective:    Patient ID: Traci Butler, female    DOB: 1975-09-05, 35 y.o.   MRN: [id number]  HPI Traci Butler is a pleasant 35 year woman with past with history of DM 2, tobacco abuse who comes the clinic for a followup visit for diabetes. She was seen by Dr. Dorise Hiss on 09/21/2010 as a new patient to our clinic and was started back on her metformin 500 mg twice a day and Amaryl 4 mg daily. She did bring her meter today which has 5 readings total and most recent readings are 129 and 146. Discussed this with Dr. Rogelia Boga and she is happy with With improvement. She denies any problems today. Denies any chest pain, short of breath, fever, nausea vomiting, chills, abdominal pain, diarrhea. She was called for this visit so we can check her glucose readings from her meter.   Review of Systems    as per history of present illness, all other systems reviewed and negative. Objective:   Physical Exam Constitutional: Vital signs reviewed.  Patient is obese, in no acute distress and cooperative with exam. Alert and oriented x3.  Head: Normocephalic and atraumatic Mouth: no erythema or exudates, MMM Eyes: PERRL, EOMI, conjunctivae normal, No scleral icterus.  Neck: Supple, Trachea midline normal ROM, No JVD Cardiovascular: RRR, S1 normal, S2 normal, no MRG, pulses symmetric and intact bilaterally Pulmonary/Chest: CTAB, no wheezes, rales, or rhonchi Abdominal: Soft. Non-tender, non-distended, bowel sounds are normal Musculoskeletal: No joint deformities, erythema, or stiffness, ROM full and no nontender Neurological: A&O x3, Strenght is normal and symmetric bilaterally, cranial nerve II-XII are grossly intact, no focal motor deficit, sensory intact to light touch bilaterally.  Skin: Warm, dry and intact. No rash, cyanosis, or clubbing.          Assessment & Plan:

## 2010-10-21 NOTE — Assessment & Plan Note (Signed)
Continue metformin 500 mg twice a day and Amaryl 4 mg daily. Last 2 readings looks better and her CBG today is 142 in clinic. After discussing with Dr. Rogelia Boga, decided to continue on same medications for now until next HbA1c.

## 2010-10-28 LAB — MONONUCLEOSIS SCREEN: Mono Screen: NEGATIVE

## 2010-10-28 LAB — RAPID STREP SCREEN (MED CTR MEBANE ONLY): Streptococcus, Group A Screen (Direct): NEGATIVE

## 2010-11-01 LAB — URINALYSIS, ROUTINE W REFLEX MICROSCOPIC
Glucose, UA: 250 — AB
Leukocytes, UA: NEGATIVE
Nitrite: POSITIVE — AB
Protein, ur: NEGATIVE
Urobilinogen, UA: 0.2

## 2010-11-01 LAB — DIFFERENTIAL
Basophils Absolute: 0.1
Eosinophils Absolute: 0 — ABNORMAL LOW
Lymphocytes Relative: 17
Lymphs Abs: 3.3
Neutrophils Relative %: 79 — ABNORMAL HIGH

## 2010-11-01 LAB — LIPASE, BLOOD: Lipase: 11

## 2010-11-01 LAB — COMPREHENSIVE METABOLIC PANEL
ALT: 14
CO2: 24
Calcium: 9.1
Chloride: 106
Creatinine, Ser: 0.65
GFR calc non Af Amer: >60
Glucose, Bld: 185 — ABNORMAL HIGH
Total Bilirubin: 1.1

## 2010-11-01 LAB — CBC
Hemoglobin: 13.4
MCHC: 33.3
MCV: 81.8
RBC: 4.93
WBC: 19.1 — ABNORMAL HIGH

## 2010-11-01 LAB — RPR: RPR Ser Ql: NONREACTIVE

## 2010-11-01 LAB — WET PREP, GENITAL: Yeast Wet Prep HPF POC: NONE SEEN

## 2010-11-01 LAB — URINE MICROSCOPIC-ADD ON

## 2010-12-14 ENCOUNTER — Other Ambulatory Visit: Payer: Self-pay | Admitting: Internal Medicine

## 2010-12-14 ENCOUNTER — Ambulatory Visit (INDEPENDENT_AMBULATORY_CARE_PROVIDER_SITE_OTHER): Payer: Self-pay | Admitting: Dietician

## 2010-12-14 ENCOUNTER — Other Ambulatory Visit: Payer: Self-pay | Admitting: Dietician

## 2010-12-14 ENCOUNTER — Encounter: Payer: Self-pay | Admitting: Dietician

## 2010-12-14 ENCOUNTER — Ambulatory Visit (INDEPENDENT_AMBULATORY_CARE_PROVIDER_SITE_OTHER): Payer: Self-pay | Admitting: *Deleted

## 2010-12-14 VITALS — Ht 66.0 in | Wt 277.4 lb

## 2010-12-14 DIAGNOSIS — IMO0001 Reserved for inherently not codable concepts without codable children: Secondary | ICD-10-CM

## 2010-12-14 DIAGNOSIS — E119 Type 2 diabetes mellitus without complications: Secondary | ICD-10-CM

## 2010-12-14 DIAGNOSIS — Z309 Encounter for contraceptive management, unspecified: Secondary | ICD-10-CM

## 2010-12-14 MED ORDER — MEDROXYPROGESTERONE ACETATE 150 MG/ML IM SUSP
150.0000 mg | Freq: Once | INTRAMUSCULAR | Status: AC
  Administered 2010-12-14: 150 mg via INTRAMUSCULAR

## 2010-12-14 MED ORDER — METFORMIN HCL 500 MG PO TABS: 500.0000 mg | ORAL_TABLET | Freq: Two times a day (BID) | ORAL | Status: DC

## 2010-12-14 MED ORDER — MEDROXYPROGESTERONE ACETATE 150 MG/ML IM SUSP
150.0000 mg | Freq: Once | INTRAMUSCULAR | Status: AC
  Administered 2011-03-06: 150 mg via INTRAMUSCULAR

## 2010-12-14 MED ORDER — METFORMIN HCL 500 MG PO TABS: 1000.0000 mg | ORAL_TABLET | Freq: Two times a day (BID) | ORAL | Status: DC

## 2010-12-14 NOTE — Telephone Encounter (Signed)
Patient requests Rx be sent to [facility] on [address]. She is in agreement with increasing metformin dose if doctor agrees.

## 2010-12-14 NOTE — Progress Notes (Signed)
Diabetes Self-Management Training (DSMT)  Initial Visit  12/14/2010 [name], identified by name and date of birth, is a 35 y.o. female with Type 2 Diabetes. Year of diabetes diagnosis: 1999 with her pregnancies Other persons present: 90 month old daughter  ASSESSMENT Patient concerns are Glycemic control and Weight control.  Height 5\' 6"  (1.676 m), weight 277 lb 6.4 oz (125.828 kg). Body mass index is 44.77 kg/(m^2). Lab Results  Component Value Date   LDLCALC 83 09/21/2010   Lab Results  Component Value Date   HGBA1C 9.5 09/21/2010   Medication Nutrition Monitor: accu chek but cannot afford the strips  Labs reviewed.  DIABETES BUNDLE: A1C in past 6 months? Yes.  Less than 7%? No LDL in past year? Yes.  Less than 100 mg/dL? Yes Microalbumin ratio in past year? Yes. Blood pressure less than 130/80? No.  Sent note to MD. Foot exam in last year? Yes. Eye exam in past year? No.  Reminded patient to schedule eye exam. Tobacco use? Yes.  Smoking cessation offered? Yes Pneumovax? Yes Flu vaccine? Yes Asprin? No  Family history of diabetes: Yes  Support systems: significant other friends parents  Special needs: None, Simplified materials  Prior DM Education: Yes   Medications See Medications list.  Is interested in learning more  Patients belief/attitude about diabetes: Diabetes can be controlled.  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   Self-Monitoring Frequency of testing: stopped testing Breakfast: 170 today after reportedly 4-6 oz regular soda and no medicine  Hyperglycemia: No Hypoglycemia: No   Meal Planning Some knowledge and Interested in improving   Assessment comments: daughter of current patients with Type 2 diabetes. Traci Butler wants to quit smoking an decrease weight. Suggest we maximize her  metformin which she is tolerating without difficulty and discontinue her amaryl for most favorable regimen for weight loss. If additional medicine needed could consider and incretin to maximize weight loss potential and have no risk of hypoglycemia   INDIVIDUAL DIABETES EDUCATION PLAN:  Nutrition management Medication Acute complication: _______________________________________________________________________  Intervention TOPICS COVERED TODAY:  Nutrition management  Role of diet in the treatment of diabetes and the relationship between the three main macronutritents and blood glucose control. Food label reading, portion sizes and measuring food. Medication  Reviewed patients medication for diabetes, action, purpose, timing of dose and side effects. Acute complication  Discussed and identified patients' treatment of hyperglycemia.  PATIENTS GOALS/PLAN (copy and paste in patient instructions so patient receives a copy): 1.  Learning Objective:       Know weight loss and quitting smoking take time and continued efforts to change behaviors 2.  Behavioral Objective:         Nutrition: To improve blood glucose control I will follow meal plan of 45-60 grams carb Sometimes 25% Medications: To improve blood glucose levels, I will take my medication as prescribed Most of the time 75% Problem Solving: To improve my blood glucose control, I will drink more water trying ot decrease my regular soda intake  Sometimes 25%  Personalized Follow-Up Plan for Ongoing Self Management Support:  Doctor's Office, friends, family and CDE visits ______________________________________________________________________   Outcomes Expected outcomes: Demonstrated interest in learning.Expect positive changes in lifestyle.  Self-care Barriers: Lack of child care, Lack of material resources  Education material provided: carb counting picture handout  Patient to contact team  via Phone if problems or questions.  Time  in: 1100     Time out: 1200  Future DSMT - 4-6 wks   Plyler, [name]

## 2010-12-14 NOTE — Telephone Encounter (Signed)
Increased to 1000 BID

## 2010-12-20 ENCOUNTER — Encounter: Payer: Self-pay | Admitting: Licensed Clinical Social Worker

## 2010-12-20 ENCOUNTER — Telehealth: Payer: Self-pay | Admitting: Licensed Clinical Social Worker

## 2010-12-20 NOTE — Telephone Encounter (Signed)
Told she is in class until 2:50.  Will send a letter to her home address to call me to make appmt.

## 2011-01-04 NOTE — Telephone Encounter (Signed)
Made contact w/ Traci Butler.  She does not have a quit date in mind and I told her to call me when she gets closer to planning an actual quit date.  I sent handouts and 1-800 quit line info to her in the mail.    She is non-commital about quit date and currently involved in school.

## 2011-03-03 ENCOUNTER — Encounter: Payer: Self-pay | Admitting: Internal Medicine

## 2011-03-06 ENCOUNTER — Encounter: Payer: Self-pay | Admitting: Internal Medicine

## 2011-03-06 ENCOUNTER — Ambulatory Visit (INDEPENDENT_AMBULATORY_CARE_PROVIDER_SITE_OTHER): Payer: Medicaid Other | Admitting: Internal Medicine

## 2011-03-06 VITALS — BP 121/80 | HR 75 | Temp 97.0°F | Ht 66.0 in | Wt 277.9 lb

## 2011-03-06 DIAGNOSIS — F17200 Nicotine dependence, unspecified, uncomplicated: Secondary | ICD-10-CM

## 2011-03-06 DIAGNOSIS — Z79899 Other long term (current) drug therapy: Secondary | ICD-10-CM

## 2011-03-06 DIAGNOSIS — Z309 Encounter for contraceptive management, unspecified: Secondary | ICD-10-CM

## 2011-03-06 DIAGNOSIS — E119 Type 2 diabetes mellitus without complications: Secondary | ICD-10-CM

## 2011-03-06 DIAGNOSIS — IMO0001 Reserved for inherently not codable concepts without codable children: Secondary | ICD-10-CM

## 2011-03-06 DIAGNOSIS — Z720 Tobacco use: Secondary | ICD-10-CM

## 2011-03-06 LAB — GLUCOSE, CAPILLARY: Glucose-Capillary: 227 mg/dL — ABNORMAL HIGH (ref 70–99)

## 2011-03-06 LAB — POCT GLYCOSYLATED HEMOGLOBIN (HGB A1C): Hemoglobin A1C: 10.1

## 2011-03-06 MED ORDER — GLIMEPIRIDE 4 MG PO TABS: 4.0000 mg | ORAL_TABLET | Freq: Every day | ORAL | Status: DC

## 2011-03-06 NOTE — Assessment & Plan Note (Signed)
The patient is seen back for her diabetes today and is currently taking metformin 1000 mg twice daily. Her hemoglobin A1c is 10.1 today. We will add back on her Amaryl which she was on previously. We will ask her to come back in 3 months for a repeat check of her hemoglobin A1c. I have stressed the need for good diet and exercise. I have given her an example diet for 1500 calories. She states that she's doing zumba about once a week and encourage her to increase that to 2 or 3 times weekly. We will see her back in 3 months.

## 2011-03-06 NOTE — Assessment & Plan Note (Signed)
Did check a urine pregnancy test today which was negative and will administer depo-provera. Will see her back in 3 months for repeat shot.

## 2011-03-06 NOTE — Progress Notes (Signed)
Subjective:     Patient ID: Traci Butler, female   DOB: 1975-07-12, 36 y.o.   MRN: [id number]  HPI The patient is a 36 year old female seen today for a routine followup visit and a Depo-Provera shot. She also has a medical history of diabetes type 2, tobacco abuse. She is still smoking half pack per day and I did advise her to quit. She does not wish to quit at this time however I did recommend it strongly. She is currently taking 1000 mg of metformin twice daily. She has had issues with insurance and has not been able to get a meter at home so she does not check her blood sugar at home ever. She is currently not having any problems. No shortness of breath, chest pain, nausea, vomiting, fevers, chills.  Review of Systems  Constitutional: Negative.  Negative for fever, chills, activity change, appetite change, fatigue and unexpected weight change.  HENT: Negative.   Eyes: Negative.   Respiratory: Negative for cough, choking, chest tightness, shortness of breath, wheezing and stridor.   Cardiovascular: Negative for chest pain, palpitations and leg swelling.  Gastrointestinal: Negative.  Negative for nausea, vomiting, abdominal pain, diarrhea, constipation and abdominal distention.  Genitourinary: Negative.   Musculoskeletal: Negative.   Skin: Negative.   Neurological: Negative for dizziness, tremors, seizures, syncope, facial asymmetry, speech difficulty, weakness, light-headedness, numbness and headaches.  Hematological: Negative.   Psychiatric/Behavioral: Negative.     Vitals: Blood pressure: 121/80 Pulse: 75 Temperature: 31F Weight: 277 pounds    Objective:   Physical Exam  Nursing note and vitals reviewed. Constitutional: She is oriented to person, place, and time. She appears well-developed and well-nourished.  HENT:  Head: Normocephalic and atraumatic.  Eyes: EOM are normal. Pupils are equal, round, and reactive to light.  Neck: Normal range of motion. Neck supple.    Cardiovascular: Normal rate, regular rhythm, normal heart sounds and intact distal pulses.   No murmur heard. Pulmonary/Chest: Effort normal and breath sounds normal. No respiratory distress. She has no wheezes. She has no rales. She exhibits no tenderness.  Abdominal: Soft. Bowel sounds are normal. She exhibits no distension. There is no tenderness. There is no rebound and no guarding.  Musculoskeletal: Normal range of motion.  Neurological: She is alert and oriented to person, place, and time.  Skin: Skin is warm and dry.       Assessment/Plan:     1. Please see problem-oriented charting.  2. Disposition-patient will be seen back in 3 months. She will get Depo-Provera shot today. We'll add back amaryl to her diabetic regimen today given her elevated HgA1C. Have given her our number and she is free to call us if she is to be seen sooner, or needs refills, or has questions.

## 2011-03-06 NOTE — Assessment & Plan Note (Signed)
Patient is still smoking 1/2 PPD and does not wish to quit at this time. She does have resources available to her if she does wish to quit. Will continue to advise cessation.

## 2011-03-06 NOTE — Patient Instructions (Signed)
You were seen today for a check up. Your diabetes is not under as good of control as we would like and we are going to restart the medicine you were taking, glimepiride (amaryl) 4 mg. Take 1 pill once a day. We are going to give you your birth control shot today. Think about stopping smoking and continue to work on your diet and exercise. Any weight you can lose will be good for your diabetes.   1500 Calorie Diabetic Diet The 1500 calorie diabetic diet limits calories to 1500 each day. Following this diet and making healthy meal choices can help improve overall health. It controls blood glucose (sugar) levels and can also help lower blood pressure and cholesterol.  SERVING SIZES Measuring foods and serving sizes helps to make sure you are getting the right amount of food. The list below tells how big or small some common serving sizes are.  1 oz.........4 stacked dice.   3 oz........Marland KitchenDeck of cards.   1 tsp.......Marland KitchenTip of little finger.   1 tbs......Marland KitchenMarland KitchenThumb.   2 tbs.......Marland KitchenGolf ball.    cup......Marland KitchenHalf of a fist.   1 cup.......Marland KitchenA fist.  GUIDELINES FOR CHOOSING FOODS The goal of this diet is to eat a variety of foods and limit calories to 1500 each day. This can be done by choosing foods that are low in calories and fat. The diet also suggests eating small amounts of food frequently. Doing this helps control your blood glucose levels, so they do not get too high or too low. Each meal or snack may include a protein food source to help you feel more satisfied. Try to eat about the same amount of food around the same time each day. This includes weekend days, travel days, and days off work. Space your meals about 4 to 5 hours apart, and add a snack between them, if you wish.  For example, a daily food plan could include breakfast, a morning snack, lunch, dinner, and an evening snack. Healthy meals and snacks have different types of foods, including whole grains, vegetables, fruits, lean meats,  poultry, fish, and dairy products. As you plan your meals, select a variety of foods. Choose from the bread and starch, vegetable, fruit, dairy, and meat/protein groups. Examples of foods from each group are listed below, with their suggested serving sizes. Use measuring cups and spoons to become familiar with what a healthy portion looks like. Bread and Starch Each serving equals 15 grams of carbohydrate.  1 slice bread.    bagel.    cup cold cereal (unsweetened).    cup hot cereal or mashed potatoes.   1 small potato (size of a computer mouse).   ? cup cooked pasta or rice.    English muffin.   1 cup broth-based soup.   3 cups of popcorn.   4 to 6 whole-wheat crackers.    cup cooked beans, peas, or corn.  Vegetables Each serving equals 5 grams of carbohydrate.   cup cooked vegetables.   1 cup raw vegetables.    cup tomato or vegetable juice.  Fruit Each serving equals 15 grams of carbohydrate.  1 small apple or orange.   1  cup watermelon or strawberries.    cup applesauce (no sugar added).   2 tbs raisins.    banana.    cup canned fruit, packed in water or in its own juice.    cup unsweetened fruit juice.  Dairy Each serving equals 12 to 15 grams of carbohydrate.  1 cup fat-free  milk.   6 oz artificially sweetened yogurt or plain yogurt.   1 cup low-fat buttermilk.   1 cup soy milk.   1 cup almond milk.  Meat/Protein  1 large egg.   2 to 3 oz meat, poultry, or fish.    cup low-fat cottage cheese.   1 tbs peanut butter.   1 oz low-fat cheese.    cup tuna, packed in water.    cup tofu.  Fat  1 tsp oil.   1 tsp trans-fat-free margarine.   1 tsp butter.   1 tsp mayonnaise.   2 tbs avocado.   1 tbs salad dressing.   1 tbs cream cheese.   2 tbs sour cream.  SAMPLE 1500 CALORIE DIET PLAN Breakfast   whole-wheat English muffin (1 carb serving).   1 tsp trans-fat-free margarine.   1 scrambled egg.   1 cup  fat-free milk (1 carb serving).   1 small orange (1 carb serving).  Lunch  Chicken wrap.   1 whole-wheat tortilla, 8-inch (1 carb servings).   2 oz chicken breast, sliced.   2 tbs low-fat salad dressing, such as [place].    cup shredded lettuce.   2 slices tomato.    cup carrot sticks.   1 small apple (1 carb serving).  Afternoon Snack  3 graham cracker squares (1 carb serving).   1 tbs peanut butter.  Dinner  2 oz lean pork chop, broiled.   1 cup brown rice (3 carb servings).    cup steamed carrots.    cup green beans.   1 cup fat-free milk (1 carb serving).   1 tsp trans-fat-free margarine.  Evening Snack   cup low-fat cottage cheese.   1 small peach or pear, sliced (or  cup canned in water) (1 carb serving).  MEAL PLAN You can use this worksheet to help you make a daily meal plan based on the 1500 calorie diabetic diet suggestions. If you are using this plan to help you control your blood glucose, you may interchange carbohydrate containing foods (dairy, starches, and fruits). Select a variety of fresh foods of varying colors and flavors. The total amount of carbohydrate in your meals or snacks is more important than making sure you include all of the food groups every time you eat. You can choose from approximately this many of the following foods to build your day's meals:  6 Starches.   3 Vegetables.   2 Fruits.   2 Dairy.   4 to 6 oz Meat/Protein.   Up to 3 Fats.  Your dietician can use this worksheet to help you decide how many servings and which types of foods are right for you. BREAKFAST Food Group and Servings / Food Choice Starch _________________________________________________________ Dairy __________________________________________________________ Fruit ___________________________________________________________ Meat/Protein____________________________________________________ Fat  ____________________________________________________________ LUNCH Food Group and Servings / Food Choice  Starch _________________________________________________________ Meat/Protein ___________________________________________________ Vegetables _____________________________________________________ Fruit __________________________________________________________ Dairy __________________________________________________________ Fat ____________________________________________________________ Aura Fey Food Group and Servings / Food Choice Dairy __________________________________________________________ Starch _________________________________________________________ Meat/Protein____________________________________________________ Zada Girt ___________________________________________________________ Laural Golden Food Group and Servings / Food Choice Starch _________________________________________________________ Meat/Protein ___________________________________________________ Dairy __________________________________________________________ Vegetable ______________________________________________________ Fruit ___________________________________________________________ Fat ____________________________________________________________ Lollie Sails Food Group and Servings / Food Choice Fruit ___________________________________________________________ Meat/Protein ____________________________________________________ Dairy __________________________________________________________ Starch __________________________________________________________ DAILY TOTALS Starches _________________________ Vegetables _______________________ Fruits ____________________________ Dairy ____________________________ Meat/Protein_____________________ Fats _____________________________ Document Released: 08/01/2004 Document Revised: 09/21/2010 Document Reviewed: 11/26/2008 [employer] Patient Information 2012  [place], [place].

## 2011-05-26 ENCOUNTER — Ambulatory Visit (INDEPENDENT_AMBULATORY_CARE_PROVIDER_SITE_OTHER): Payer: Self-pay | Admitting: *Deleted

## 2011-05-26 DIAGNOSIS — IMO0001 Reserved for inherently not codable concepts without codable children: Secondary | ICD-10-CM

## 2011-05-26 DIAGNOSIS — Z309 Encounter for contraceptive management, unspecified: Secondary | ICD-10-CM

## 2011-05-26 MED ORDER — MEDROXYPROGESTERONE ACETATE 150 MG/ML IM SUSP
150.0000 mg | Freq: Once | INTRAMUSCULAR | Status: AC
  Administered 2011-05-26: 150 mg via INTRAMUSCULAR

## 2011-05-26 NOTE — Progress Notes (Signed)
Stated she had some bleeding upon wiping in April x 2 weeks.  No bleeding now. Talked to the Attending, Dr. Rogelia Boga. Preg test negative in Feb; no needed today per Dr Rogelia Boga. Pt instructed to call if develop breast/pelvic pain and increased Bleeding. Pt agreed. Next Depo July 25 - appt card given to pt.

## 2011-06-22 ENCOUNTER — Encounter: Payer: Medicaid Other | Admitting: Internal Medicine

## 2011-06-22 ENCOUNTER — Encounter: Payer: Self-pay | Admitting: Internal Medicine

## 2012-04-04 ENCOUNTER — Encounter: Payer: Self-pay | Admitting: Internal Medicine

## 2012-08-01 ENCOUNTER — Other Ambulatory Visit: Payer: Self-pay

## 2013-01-23 HISTORY — PX: WISDOM TOOTH EXTRACTION: SHX21

## 2013-12-17 ENCOUNTER — Emergency Department (HOSPITAL_COMMUNITY)
Admission: EM | Admit: 2013-12-17 | Discharge: 2013-12-17 | Disposition: A | Discharge status: Home / Self Care | Payer: No Typology Code available for payment source | Attending: Emergency Medicine | Admitting: Emergency Medicine

## 2013-12-17 ENCOUNTER — Emergency Department (HOSPITAL_COMMUNITY): Payer: No Typology Code available for payment source

## 2013-12-17 ENCOUNTER — Encounter (HOSPITAL_COMMUNITY): Payer: Self-pay | Admitting: *Deleted

## 2013-12-17 DIAGNOSIS — W19XXXA Unspecified fall, initial encounter: Secondary | ICD-10-CM

## 2013-12-17 DIAGNOSIS — Y9289 Other specified places as the place of occurrence of the external cause: Secondary | ICD-10-CM | POA: Insufficient documentation

## 2013-12-17 DIAGNOSIS — Y9389 Activity, other specified: Secondary | ICD-10-CM | POA: Diagnosis not present

## 2013-12-17 DIAGNOSIS — Y990 Civilian activity done for income or pay: Secondary | ICD-10-CM | POA: Diagnosis not present

## 2013-12-17 DIAGNOSIS — R52 Pain, unspecified: Secondary | ICD-10-CM

## 2013-12-17 DIAGNOSIS — S99911A Unspecified injury of right ankle, initial encounter: Secondary | ICD-10-CM | POA: Diagnosis present

## 2013-12-17 DIAGNOSIS — Z720 Tobacco use: Secondary | ICD-10-CM | POA: Diagnosis not present

## 2013-12-17 DIAGNOSIS — W108XXA Fall (on) (from) other stairs and steps, initial encounter: Secondary | ICD-10-CM | POA: Insufficient documentation

## 2013-12-17 DIAGNOSIS — E119 Type 2 diabetes mellitus without complications: Secondary | ICD-10-CM | POA: Insufficient documentation

## 2013-12-17 DIAGNOSIS — S82891A Other fracture of right lower leg, initial encounter for closed fracture: Secondary | ICD-10-CM | POA: Insufficient documentation

## 2013-12-17 DIAGNOSIS — Z79899 Other long term (current) drug therapy: Secondary | ICD-10-CM | POA: Insufficient documentation

## 2013-12-17 HISTORY — DX: Type 2 diabetes mellitus without complications: E11.9

## 2013-12-17 MED ORDER — HYDROCODONE-ACETAMINOPHEN 5-325 MG PO TABS: 1.0000 | ORAL_TABLET | ORAL | Status: DC | PRN

## 2013-12-17 MED ORDER — IBUPROFEN 400 MG PO TABS
800.0000 mg | ORAL_TABLET | Freq: Once | ORAL | Status: AC
  Administered 2013-12-17: 800 mg via ORAL
  Filled 2013-12-17: qty 2

## 2013-12-17 NOTE — Progress Notes (Signed)
Orthopedic Tech Progress Note Patient Details:  Traci Butler [birth year] [id number]  Ortho Devices Type of Ortho Device: Ace wrap, Crutches, Stirrup splint Ortho Device/Splint Location: rle Ortho Device/Splint Interventions: Application   Traci Butler 12/17/2013, 10:14 AM

## 2013-12-17 NOTE — ED Notes (Signed)
Patient states she was at  work last pm was walking sown some steps and fell twisting her right ankle. C/o pain in ankle and the top of her foot.States increased pain with weight this am. Ankle is swollen positive pedal pulse

## 2013-12-17 NOTE — ED Provider Notes (Signed)
CSN: [id number]     Arrival date & time 12/17/13  0804 History   First MD Initiated Contact with Patient 12/17/13 [phone]     Chief Complaint  Patient presents with  . Ankle Pain     (Consider location/radiation/quality/duration/timing/severity/associated sxs/prior Treatment) The history is provided by the patient.     Patient presents with right ankle and right foot pain after stepping on an uneven surface last night and sliding off, inverting her ankle.  Has throbbing pain, constant, 7/10 intensity, radiating into her right lower leg.  Denies weakness, numbness.  Denies other injury.  She did not fall.  Took 2 advil PM last night with temporary improvement.    Past Medical History  Diagnosis Date  . Diabetes mellitus without complication    Past Surgical History  Procedure Laterality Date  . Appendectomy  2008  . Cesarean section  2005    live birth  . Cesarean section  2011    live birth   Family History  Problem Relation Age of Onset  . Diabetes Mother   . Hypertension Father    History  Substance Use Topics  . Smoking status: Current Some Day Smoker -- 0.50 packs/day  . Smokeless tobacco: Never Used     Comment: trying  . Alcohol Use: Yes   OB History    No data available     Review of Systems  Constitutional: Negative for fever.  Cardiovascular: Negative for chest pain.  Gastrointestinal: Negative for abdominal pain.  Musculoskeletal: Negative for neck pain.  Skin: Negative for color change and wound.  Allergic/Immunologic: Negative for immunocompromised state.  Neurological: Negative for syncope, weakness, numbness and headaches.  Psychiatric/Behavioral: Negative for self-injury.      Allergies  Review of patient's allergies indicates no known allergies.  Home Medications   Prior to Admission medications   Medication Sig Start Date End Date Taking? Authorizing Provider  Ibuprofen-Diphenhydramine HCl (ADVIL PM) 200-25 MG CAPS Take 2 tablets by mouth  at bedtime as needed (for pain/sleep).   Yes Historical Provider, MD  ACCU-CHEK FASTCLIX LANCETS MISC 1 each by Does not apply route 1 day or 1 dose. 09/21/10   Olga Millers, MD  Blood Glucose Monitoring Suppl (ACCU-CHEK NANO SMARTVIEW) W/DEVICE KIT 1 each by Does not apply route once. 09/21/10   Olga Millers, MD  glimepiride (AMARYL) 4 MG tablet Take 1 tablet (4 mg total) by mouth daily before breakfast. Patient not taking: Reported on 12/17/2013 03/06/11   Olga Millers, MD  metFORMIN (GLUCOPHAGE) 500 MG tablet Take 2 tablets (1,000 mg total) by mouth 2 (two) times daily. Patient not taking: Reported on 12/17/2013 12/14/10   Acquanetta Chain, DO   BP 164/100 mmHg  Pulse 93  Temp(Src) 98.1 F (36.7 C)  Resp 19  Ht 5' 7"  (1.702 m)  Wt 270 lb (122.471 kg)  BMI 42.28 kg/m2  SpO2 100% Physical Exam  Constitutional: She appears well-developed and well-nourished. No distress.  HENT:  Head: Normocephalic and atraumatic.  Neck: Neck supple.  Pulmonary/Chest: Effort normal.  Musculoskeletal:       Right ankle: She exhibits decreased range of motion and swelling. She exhibits no ecchymosis, no deformity, no laceration and normal pulse. Tenderness. Lateral malleolus tenderness found. Achilles tendon exhibits no pain.       Right foot: There is tenderness. There is normal range of motion, no bony tenderness, no swelling, normal capillary refill, no crepitus, no deformity and no laceration.  Feet:  Neurological: She is alert.  Skin: She is not diaphoretic.  Nursing note and vitals reviewed.   ED Course  Procedures (including critical care time) Labs Review Labs Reviewed - No data to display  Imaging Review Dg Ankle Complete Right  12/17/2013   CLINICAL DATA:  38 year old female with foot and lateral ankle pain and swelling. She fell and twisted her ankle earlier this morning.  EXAM: RIGHT ANKLE - COMPLETE 3+ VIEW  COMPARISON:  Concurrently obtained radiographs of  the foot  FINDINGS: Soft tissue swelling is noted about the ankle particularly overlying the lateral malleolus. There appears to be an acute avulsion fracture at the distal fibular tip. The ankle mortise remains congruent. The talar dome is intact. No ankle joint effusion. Normal bony mineralization. No lytic or blastic osseous lesion.  IMPRESSION: Small avulsion fracture from the distal tip of the fibula.   Electronically Signed   By: Jacqulynn Cadet M.D.   On: 12/17/2013 09:12   Dg Foot Complete Right  12/17/2013   CLINICAL DATA:  Twisting injury down steps.  Initial encounter.  EXAM: RIGHT FOOT COMPLETE - 3+ VIEW  COMPARISON:  None.  FINDINGS: There is no evidence of fracture or dislocation. There is no evidence of arthropathy or other focal bone abnormality. Soft tissues are unremarkable.  IMPRESSION: Negative.   Electronically Signed   By: Aletta Edouard M.D.   On: 12/17/2013 09:13     EKG Interpretation None      Engaged in joint decision making with patient, she prefers splint to CAM walker.    MDM   Final diagnoses:  Fall  Pain  Avulsion fracture of right ankle, closed, initial encounter    Afebrile, nontoxic patient with right ankle pain after inversion injury yesterday.  Neurovascularly intact.  Xray shows avulsion fracture of distal fibular.  Placed in stirrup splint, given crutches.  D/C home with norco, orthopedic follow up.  Discussed result, findings, treatment, and follow up  with patient.  Pt given return precautions.  Pt verbalizes understanding and agrees with plan.         Clayton Bibles, PA-C 12/17/13 Aarav Burgett Hamlin, MD 12/18/13 [phone]

## 2013-12-17 NOTE — Discharge Instructions (Signed)
Read the information below.  Use the prescribed medication as directed.  Please discuss all new medications with your pharmacist.  Do not take additional tylenol while taking the prescribed pain medication to avoid overdose.  You may return to the Emergency Department at any time for worsening condition or any new symptoms that concern you.  If there is any possibility that you might be pregnant, please let your health care provider know and discuss this with the pharmacist to ensure medication safety.  If you develop uncontrolled pain, weakness or numbness of the extremity, severe discoloration of the skin, or you are unable to move your toes, return to the ER for a recheck.       Ankle Fracture A fracture is a break in a bone. A cast or splint may be used to protect the ankle and heal the break. Sometimes, surgery is needed. HOME CARE  Use crutches as told by your doctor. It is very important that you use your crutches correctly.  Do not put weight or pressure on the injured ankle until told by your doctor.  Keep your ankle raised (elevated) when sitting or lying down.  Apply ice to the ankle:  Put ice in a plastic bag.  Place a towel between your cast and the bag.  Leave the ice on for 20 minutes, 2-3 times a day.  If you have a plaster or fiberglass cast:  Do not try to scratch under the cast with any objects.  Check the skin around the cast every day. You may put lotion on red or sore areas.  Keep your cast dry and clean.  If you have a plaster splint:  Wear the splint as told by your doctor.  You can loosen the elastic around the splint if your toes get numb, tingle, or turn cold or blue.  Do not put pressure on any part of your cast or splint. It may break. Rest your plaster splint or cast only on a pillow the first 24 hours until it is fully hardened.  Cover your cast or splint with a plastic bag during showers.  Do not lower your cast or splint into water.  Take  medicine as told by your doctor.  Do not drive until your doctor says it is safe.  Follow-up with your doctor as told. It is very important that you go to your follow-up visits. GET HELP IF: The swelling and discomfort gets worse.  GET HELP RIGHT AWAY IF:   Your splint or cast breaks.  You continue to have very bad pain.  You have new pain or swelling after your splint or cast was put on.  Your skin or toes below the injured ankle:  Turn blue or gray.  Feel cold, numb, or you cannot feel them.  There is a bad smell or yellowish white fluid (pus) coming from under the splint or cast. MAKE SURE YOU:   Understand these instructions.  Will watch your condition.  Will get help right away if you are not doing well or get worse. Document Released: 11/06/2008 Document Revised: 10/30/2012 Document Reviewed: 08/08/2012 [employer] Patient Information 2015 [place], [place]. This information is not intended to replace advice given to you by your health care provider. Make sure you discuss any questions you have with your health care provider.  Cast or Splint Care Casts and splints support injured limbs and keep bones from moving while they heal.  HOME CARE  Keep the cast or splint uncovered during the drying  period.  A plaster cast can take 24 to 48 hours to dry.  A fiberglass cast will dry in less than 1 hour.  Do not rest the cast on anything harder than a pillow for 24 hours.  Do not put weight on your injured limb. Do not put pressure on the cast. Wait for your doctor's approval.  Keep the cast or splint dry.  Cover the cast or splint with a plastic bag during baths or wet weather.  If you have a cast over your chest and belly (trunk), take sponge baths until the cast is taken off.  If your cast gets wet, dry it with a towel or blow dryer. Use the cool setting on the blow dryer.  Keep your cast or splint clean. Wash a dirty cast with a damp cloth.  Do not put any objects  under your cast or splint.  Do not scratch the skin under the cast with an object. If itching is a problem, use a blow dryer on a cool setting over the itchy area.  Do not trim or cut your cast.  Do not take out the padding from inside your cast.  Exercise your joints near the cast as told by your doctor.  Raise (elevate) your injured limb on 1 or 2 pillows for the first 1 to 3 days. GET HELP IF:  Your cast or splint cracks.  Your cast or splint is too tight or too loose.  You itch badly under the cast.  Your cast gets wet or has a soft spot.  You have a bad smell coming from the cast.  You get an object stuck under the cast.  Your skin around the cast becomes red or sore.  You have new or more pain after the cast is put on. GET HELP RIGHT AWAY IF:  You have fluid leaking through the cast.  You cannot move your fingers or toes.  Your fingers or toes turn blue or white or are cool, painful, or puffy (swollen).  You have tingling or lose feeling (numbness) around the injured area.  You have bad pain or pressure under the cast.  You have trouble breathing or have shortness of breath.  You have chest pain. Document Released: 05/11/2010 Document Revised: 09/11/2012 Document Reviewed: 07/18/2012 [facility] Patient Information 2015 [place], [place]. This information is not intended to replace advice given to you by your health care provider. Make sure you discuss any questions you have with your health care provider.

## 2014-08-28 ENCOUNTER — Emergency Department (HOSPITAL_COMMUNITY)
Admission: EM | Admit: 2014-08-28 | Discharge: 2014-08-28 | Disposition: A | Discharge status: Home / Self Care | Payer: Self-pay | Attending: Emergency Medicine | Admitting: Emergency Medicine

## 2014-08-28 ENCOUNTER — Encounter (HOSPITAL_COMMUNITY): Payer: Self-pay | Admitting: Emergency Medicine

## 2014-08-28 ENCOUNTER — Emergency Department (HOSPITAL_COMMUNITY): Payer: Self-pay

## 2014-08-28 DIAGNOSIS — Z720 Tobacco use: Secondary | ICD-10-CM | POA: Insufficient documentation

## 2014-08-28 DIAGNOSIS — E119 Type 2 diabetes mellitus without complications: Secondary | ICD-10-CM | POA: Insufficient documentation

## 2014-08-28 DIAGNOSIS — R7611 Nonspecific reaction to tuberculin skin test without active tuberculosis: Secondary | ICD-10-CM | POA: Insufficient documentation

## 2014-08-28 NOTE — Discharge Instructions (Signed)
Your chest xray is negative. Your quantiferon test is pending and if returns abnormal you will be called. Follow up with your doctor.

## 2014-08-28 NOTE — ED Notes (Signed)
Pt sts had pre hire TB test that they read as positive and sts needs chest xray

## 2014-08-28 NOTE — ED Notes (Signed)
Pt from employment office, states had tb skin test done Wednesday and has reddness noted to area. Pt denies any night sweats, hemoptysis, cough, congestion, or sever weight loss. Pt states she has no complaints at this time, has had skin test in the past that have gotten red but then "got better." nad noted.

## 2014-08-28 NOTE — ED Provider Notes (Signed)
CSN: [id number]     Arrival date & time 08/28/14  1104 History  This chart was scribed for non-physician practitioner Jeannett Senior, PA-C working with Deno Etienne, DO by Zola Button, ED Scribe. This patient was seen in room TR09C/TR09C and the patient's care was started at 11:35 AM.       Chief Complaint  Patient presents with  . chest xray    The history is provided by the patient. No language interpreter was used.    HPI Comments: Traci Butler is a 39 y.o. female who presents to the Emergency Department for a CXR. Patient states she had a pre-hire TB test two days ago that was read as positive and requires a CXR to start working. Patient reports having a cough at baseline secondary to smoking. She denies fever, night sweats, and unexpected weight loss. She also denies possibility of pregnancy. 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   Past Medical History  Diagnosis Date  . Diabetes mellitus without complication    Past Surgical History  Procedure Laterality Date  . Appendectomy  2008  . Cesarean section  2005    live birth  . Cesarean section  2011    live birth   Family History  Problem Relation Age of Onset  . Diabetes Mother   . Hypertension Father    History  Substance Use Topics  . Smoking status: Current Some Day Smoker -- 0.50 packs/day  . Smokeless tobacco: Never Used     Comment: trying  . Alcohol Use: Yes   OB History    No data available     Review of Systems  Constitutional: Negative for fever, chills, diaphoresis and unexpected weight change.  Respiratory: Positive for cough.       Allergies  Review of patient's allergies indicates no known allergies.  Home Medications   Prior to Admission medications   Medication Sig Start Date End Date Taking? Authorizing Provider  ACCU-CHEK FASTCLIX LANCETS MISC 1 each by Does not apply route 1 day or 1 dose. 09/21/10   Olga Millers, MD  Blood Glucose Monitoring Suppl (ACCU-CHEK NANO  SMARTVIEW) W/DEVICE KIT 1 each by Does not apply route once. 09/21/10   Olga Millers, MD  glimepiride (AMARYL) 4 MG tablet Take 1 tablet (4 mg total) by mouth daily before breakfast. Patient not taking: Reported on 12/17/2013 03/06/11   Olga Millers, MD  HYDROcodone-acetaminophen (NORCO/VICODIN) 5-325 MG per tablet Take 1-2 tablets by mouth every 4 (four) hours as needed for moderate pain or severe pain. 12/17/13   Clayton Bibles, PA-C  Ibuprofen-Diphenhydramine HCl (ADVIL PM) 200-25 MG CAPS Take 2 tablets by mouth at bedtime as needed (for pain/sleep).    Historical Provider, MD  metFORMIN (GLUCOPHAGE) 500 MG tablet Take 2 tablets (1,000 mg total) by mouth 2 (two) times daily. Patient not taking: Reported on 12/17/2013 12/14/10   Acquanetta Chain, DO   BP 169/99 mmHg  Pulse 90  Temp(Src) 98.6 F (37 C) (Oral)  Resp 18  SpO2 99% Physical Exam  Constitutional: She is oriented to person, place, and time. She appears well-developed and well-nourished. No distress.  HENT:  Head: Normocephalic and atraumatic.  Mouth/Throat: Oropharynx is clear and moist. No oropharyngeal exudate.  Eyes: Pupils are equal, round, and reactive to light.  Neck: Neck supple.  Cardiovascular: Normal rate, regular rhythm and normal heart sounds.   Pulmonary/Chest: Effort normal and breath sounds normal. No respiratory distress. She has no wheezes. She has  no rales.  Musculoskeletal: She exhibits no edema.  Neurological: She is alert and oriented to person, place, and time. No cranial nerve deficit.  Skin: Skin is warm and dry. No rash noted.  10 mm area of induration to the left anterior forearm  Psychiatric: She has a normal mood and affect. Her behavior is normal.  Nursing note and vitals reviewed.   ED Course  Procedures  DIAGNOSTIC STUDIES: Oxygen Saturation is 99% on room air, normal by my interpretation.    COORDINATION OF CARE: 11:39 AM-Discussed treatment plan which includes CXR with  patient/guardian at bedside and patient/guardian agreed to plan.    Labs Review Labs Reviewed - No data to display  Imaging Review No results found.   EKG Interpretation None      MDM   Final diagnoses:  None   patient is here positive skin PPD. It measures 10 mm. The patient is a Dietitian. Will get a chest x-ray to rule out active TB. Will get QuantiFERON for further evaluation. Patient asymptomatic at this time.  Chest x-ray is negative. We'll defer on his pending.  Filed Vitals:   08/28/14 1123 08/28/14 1231  BP: 169/99 160/98  Pulse: 90 71  Temp: 98.6 F (37 C) 98.6 F (37 C)  TempSrc: Oral Oral  Resp: 18 16  SpO2: 99% 100%     I personally performed the services described in this documentation, which was scribed in my presence. The recorded information has been reviewed and is accurate.   Jeannett Senior, PA-C 08/28/14 [place], DO 08/28/14 [phone]

## 2014-08-31 LAB — QUANTIFERON IN TUBE
QFT TB AG MINUS NIL VALUE: 0.02 [IU]/mL
QUANTIFERON TB AG VALUE: 0.06 [IU]/mL
QUANTIFERON TB GOLD: NEGATIVE
Quantiferon Nil Value: 0.04 IU/mL

## 2014-08-31 LAB — QUANTIFERON TB GOLD ASSAY (BLOOD)

## 2015-07-09 ENCOUNTER — Ambulatory Visit (INDEPENDENT_AMBULATORY_CARE_PROVIDER_SITE_OTHER): Payer: Self-pay | Admitting: Internal Medicine

## 2015-07-09 VITALS — BP 191/86 | HR 79 | Temp 98.5°F | Wt 276.7 lb

## 2015-07-09 DIAGNOSIS — F17200 Nicotine dependence, unspecified, uncomplicated: Secondary | ICD-10-CM

## 2015-07-09 DIAGNOSIS — I10 Essential (primary) hypertension: Secondary | ICD-10-CM | POA: Insufficient documentation

## 2015-07-09 DIAGNOSIS — E1169 Type 2 diabetes mellitus with other specified complication: Secondary | ICD-10-CM

## 2015-07-09 DIAGNOSIS — E1165 Type 2 diabetes mellitus with hyperglycemia: Secondary | ICD-10-CM

## 2015-07-09 DIAGNOSIS — Z6841 Body Mass Index (BMI) 40.0 and over, adult: Secondary | ICD-10-CM

## 2015-07-09 DIAGNOSIS — Z720 Tobacco use: Secondary | ICD-10-CM

## 2015-07-09 DIAGNOSIS — Z0000 Encounter for general adult medical examination without abnormal findings: Secondary | ICD-10-CM | POA: Insufficient documentation

## 2015-07-09 DIAGNOSIS — E119 Type 2 diabetes mellitus without complications: Secondary | ICD-10-CM

## 2015-07-09 DIAGNOSIS — E782 Mixed hyperlipidemia: Secondary | ICD-10-CM

## 2015-07-09 LAB — GLUCOSE, CAPILLARY: GLUCOSE-CAPILLARY: 297 mg/dL — AB (ref 65–99)

## 2015-07-09 LAB — POCT GLYCOSYLATED HEMOGLOBIN (HGB A1C): Hemoglobin A1C: 11.9

## 2015-07-09 MED ORDER — METFORMIN HCL 1000 MG PO TABS: 1000.0000 mg | ORAL_TABLET | Freq: Two times a day (BID) | ORAL | Status: DC

## 2015-07-09 MED ORDER — HYDROCHLOROTHIAZIDE 12.5 MG PO CAPS: 12.5000 mg | ORAL_CAPSULE | Freq: Every day | ORAL | Status: DC

## 2015-07-09 MED ORDER — LISINOPRIL 10 MG PO TABS: 10.0000 mg | ORAL_TABLET | Freq: Every day | ORAL | Status: DC

## 2015-07-09 NOTE — Progress Notes (Signed)
Subjective:   Patient ID: Traci Butler female   DOB: October 29, 1975 40 y.o.   MRN: [id number]  HPI: Traci Butler is a 40 y.o. female w/ PMHx of DM type II, presents to the clinic today for a follow-up visit for her DM. Patient has not been seen in the clinic for a few years and is interested in re-establishing care at the request of her family. She has not been taking any medications. Used to take Metformin 1000 mg bid and a sulfonurea at some point but has not for a few years. She has no complaints, says she feels great. Denies any symptoms of polyuria, polydipsia, dizziness, confusion, vision changes, neuropathic pain, chest discomfort, and SOB.   BP is elevated today, patient states she has never previously been told she had HTN in the past, however, looking back, it appears her BP has always been mild to moderately elevated.   She does smoke 1/2 pack per day, drinks alcohol on occasion, smokes marijuana once in a while. No other recreational drugs.   Family history is significant for HTN and DM type II. No family h/o bleeding or clotting disorders, or cancer.   Past Medical History  Diagnosis Date  . Diabetes mellitus without complication    Current Outpatient Prescriptions  Medication Sig Dispense Refill  . ACCU-CHEK FASTCLIX LANCETS MISC 1 each by Does not apply route 1 day or 1 dose. 102 each 6  . Blood Glucose Monitoring Suppl (ACCU-CHEK NANO SMARTVIEW) W/DEVICE KIT 1 each by Does not apply route once. 1 kit 0  . glimepiride (AMARYL) 4 MG tablet Take 1 tablet (4 mg total) by mouth daily before breakfast. (Patient not taking: Reported on 12/17/2013) 30 tablet 3  . HYDROcodone-acetaminophen (NORCO/VICODIN) 5-325 MG per tablet Take 1-2 tablets by mouth every 4 (four) hours as needed for moderate pain or severe pain. 20 tablet 0  . Ibuprofen-Diphenhydramine HCl (ADVIL PM) 200-25 MG CAPS Take 2 tablets by mouth at bedtime as needed (for pain/sleep).    . metFORMIN (GLUCOPHAGE)  500 MG tablet Take 2 tablets (1,000 mg total) by mouth 2 (two) times daily. (Patient not taking: Reported on 12/17/2013) 30 tablet 3   No current facility-administered medications for this visit.    Review of Systems: General: Denies fever, chills, diaphoresis, appetite change and fatigue.  Respiratory: Denies SOB, DOE, cough, and wheezing.   Cardiovascular: Denies chest pain and palpitations.  Gastrointestinal: Denies nausea, vomiting, abdominal pain, and diarrhea.  Genitourinary: Denies dysuria, increased frequency, and flank pain. Endocrine: Denies hot or cold intolerance, polyuria, and polydipsia. Musculoskeletal: Denies myalgias, back pain, joint swelling, arthralgias and gait problem.  Skin: Denies pallor, rash and wounds.  Neurological: Denies dizziness, seizures, syncope, weakness, lightheadedness, numbness and headaches.  Psychiatric/Behavioral: Denies mood changes, and sleep disturbances.  Objective:   Physical Exam: Filed Vitals:   07/09/15 1426  BP: 191/86  Pulse: 79  Temp: 98.5 F (36.9 C)  TempSrc: Oral  Weight: 276 lb 11.2 oz (125.51 kg)  SpO2: 100%    General: Obese AA female, alert, cooperative, NAD. HEENT: PERRL, EOMI. Moist mucus membranes Neck: Full range of motion without pain, supple, no lymphadenopathy or carotid bruits Lungs: Clear to ascultation bilaterally, normal work of respiration, no wheezes, rales, rhonchi Heart: RRR, no murmurs, gallops, or rubs Abdomen: Soft, non-tender, non-distended, BS + Extremities: No cyanosis, clubbing, or edema Neurologic: Alert & oriented X3, cranial nerves II-XII intact, strength grossly intact, sensation intact to light touch   Assessment &  Plan:   Please see problem based assessment and plan.

## 2015-07-09 NOTE — Patient Instructions (Signed)
1. Please make a follow up appointment for 2 weeks.   2. Please take all medications as previously prescribed with the following changes:  Start taking Metformin 1000 mg twice daily for your Diabetes.   Start Hydrochlorothiazide 12.5 mg once daily as well as Lisinopril 10 mg once daily for high blood pressure.   Please try and quit smoking.   3. If you have worsening of your symptoms or new symptoms arise, please call the clinic [phone]), or go to the ER immediately if symptoms are severe.    Smoking Cessation, Tips for Success If you are ready to quit smoking, congratulations! You have chosen to help yourself be healthier. Cigarettes bring nicotine, tar, carbon monoxide, and other irritants into your body. Your lungs, heart, and blood vessels will be able to work better without these poisons. There are many different ways to quit smoking. Nicotine gum, nicotine patches, a nicotine inhaler, or nicotine nasal spray can help with physical craving. Hypnosis, support groups, and medicines help break the habit of smoking. WHAT THINGS CAN I DO TO MAKE QUITTING EASIER?  Here are some tips to help you quit for good:  Pick a date when you will quit smoking completely. Tell all of your friends and family about your plan to quit on that date.  Do not try to slowly cut down on the number of cigarettes you are smoking. Pick a quit date and quit smoking completely starting on that day.  Throw away all cigarettes.   Clean and remove all ashtrays from your home, work, and car.  On a card, write down your reasons for quitting. Carry the card with you and read it when you get the urge to smoke.  Cleanse your body of nicotine. Drink enough water and fluids to keep your urine clear or pale yellow. Do this after quitting to flush the nicotine from your body.  Learn to predict your moods. Do not let a bad situation be your excuse to have a cigarette. Some situations in your life might tempt you into  wanting a cigarette.  Never have "just one" cigarette. It leads to wanting another and another. Remind yourself of your decision to quit.  Change habits associated with smoking. If you smoked while driving or when feeling stressed, try other activities to replace smoking. Stand up when drinking your coffee. Brush your teeth after eating. Sit in a different chair when you read the paper. Avoid alcohol while trying to quit, and try to drink fewer caffeinated beverages. Alcohol and caffeine may urge you to smoke.  Avoid foods and drinks that can trigger a desire to smoke, such as sugary or spicy foods and alcohol.  Ask people who smoke not to smoke around you.  Have something planned to do right after eating or having a cup of coffee. For example, plan to take a walk or exercise.  Try a relaxation exercise to calm you down and decrease your stress. Remember, you may be tense and nervous for the first 2 weeks after you quit, but this will pass.  Find new activities to keep your hands busy. Play with a pen, coin, or rubber band. Doodle or draw things on paper.  Brush your teeth right after eating. This will help cut down on the craving for the taste of tobacco after meals. You can also try mouthwash.   Use oral substitutes in place of cigarettes. Try using lemon drops, carrots, cinnamon sticks, or chewing gum. Keep them handy so they are available  when you have the urge to smoke.  When you have the urge to smoke, try deep breathing.  Designate your home as a nonsmoking area.  If you are a heavy smoker, ask your health care provider about a prescription for nicotine chewing gum. It can ease your withdrawal from nicotine.  Reward yourself. Set aside the cigarette money you save and buy yourself something nice.  Look for support from others. Join a support group or smoking cessation program. Ask someone at home or at work to help you with your plan to quit smoking.  Always ask yourself, "Do I  need this cigarette or is this just a reflex?" Tell yourself, "Today, I choose not to smoke," or "I do not want to smoke." You are reminding yourself of your decision to quit.  Do not replace cigarette smoking with electronic cigarettes (commonly called e-cigarettes). The safety of e-cigarettes is unknown, and some may contain harmful chemicals.  If you relapse, do not give up! Plan ahead and think about what you will do the next time you get the urge to smoke. HOW WILL I FEEL WHEN I QUIT SMOKING? You may have symptoms of withdrawal because your body is used to nicotine (the addictive substance in cigarettes). You may crave cigarettes, be irritable, feel very hungry, cough often, get headaches, or have difficulty concentrating. The withdrawal symptoms are only temporary. They are strongest when you first quit but will go away within 10-14 days. When withdrawal symptoms occur, stay in control. Think about your reasons for quitting. Remind yourself that these are signs that your body is healing and getting used to being without cigarettes. Remember that withdrawal symptoms are easier to treat than the major diseases that smoking can cause.  Even after the withdrawal is over, expect periodic urges to smoke. However, these cravings are generally short lived and will go away whether you smoke or not. Do not smoke! WHAT RESOURCES ARE AVAILABLE TO HELP ME QUIT SMOKING? Your health care provider can direct you to community resources or hospitals for support, which may include:  Group support.  Education.  Hypnosis.  Therapy.   This information is not intended to replace advice given to you by your health care provider. Make sure you discuss any questions you have with your health care provider.   Document Released: 10/08/2003 Document Revised: 01/30/2014 Document Reviewed: 06/27/2012 Elsevier Interactive Patient Education [employer].

## 2015-07-10 DIAGNOSIS — E785 Hyperlipidemia, unspecified: Secondary | ICD-10-CM | POA: Insufficient documentation

## 2015-07-10 LAB — LIPID PANEL
CHOL/HDL RATIO: 5.3 ratio — AB (ref 0.0–4.4)
Cholesterol, Total: 170 mg/dL (ref 100–199)
HDL: 32 mg/dL — AB (ref >39)
LDL Calculated: 89 mg/dL (ref 0–99)
Triglycerides: 246 mg/dL — ABNORMAL HIGH (ref 0–149)
VLDL Cholesterol Cal: 49 mg/dL — ABNORMAL HIGH (ref 5–40)

## 2015-07-10 LAB — BMP8+ANION GAP
Anion Gap: 19 mmol/L — ABNORMAL HIGH (ref 10.0–18.0)
BUN / CREAT RATIO: 14 (ref 9–23)
BUN: 10 mg/dL (ref 6–20)
CO2: 21 mmol/L (ref 18–29)
CREATININE: 0.74 mg/dL (ref 0.57–1.00)
Calcium: 9.1 mg/dL (ref 8.7–10.2)
Chloride: 99 mmol/L (ref 96–106)
GFR, EST AFRICAN AMERICAN: 118 mL/min/{1.73_m2} (ref >59)
GFR, EST NON AFRICAN AMERICAN: 102 mL/min/{1.73_m2} (ref >59)
Glucose: 247 mg/dL — ABNORMAL HIGH (ref 65–99)
Potassium: 3.9 mmol/L (ref 3.5–5.2)
Sodium: 139 mmol/L (ref 134–144)

## 2015-07-10 NOTE — Assessment & Plan Note (Signed)
BP Readings from Last 3 Encounters:  07/09/15 191/86  08/28/14 160/98  12/17/13 155/89    Lab Results  Component Value Date   NA 139 07/09/2015   K 3.9 07/09/2015   CREATININE 0.74 07/09/2015    Assessment: Blood pressure control:  Uncontrolled Progress toward BP goal:   Not at goal Comments: Patient has never been told she has high blood pressure, however, looking back, it has always been a bit on the high side. Has family h/o HTN. Never on medications for this. No recent symptoms.   Plan: Medications:  Start Lisinopril 10 mg daily + HCTZ 12.5 mg daily.  Other plans: Checked BMP, normal renal function. RTC in 2-4 weeks, repeat BMP at that time.

## 2015-07-10 NOTE — Assessment & Plan Note (Signed)
Patient smokes 1/2 pack per day, has for some time. Is interested in quitting, however, currently cannot afford medications or nicotine replacement therapy given her lack of health insurance.  -Given information on smoking cessation, encouraged her to quit, especially given her other risk factors for CV disease.

## 2015-07-10 NOTE — Assessment & Plan Note (Signed)
Discussed diet and exercise at length today. Emphasized the effect weight loss has on control of DM type II.  -Continue to encourage weight loss at future visits.

## 2015-07-10 NOTE — Assessment & Plan Note (Signed)
Checked lipids, HbA1c, foot exam today.

## 2015-07-10 NOTE — Assessment & Plan Note (Signed)
Lab Results  Component Value Date   HGBA1C 11.9 07/09/2015   HGBA1C 10.1 03/06/2011   HGBA1C 9.5 09/21/2010     Assessment: Diabetes control:  Uncontrolled Progress toward A1C goal:   Not at goal Comments: Patient has not been taking any medications for her DM type II in quite some time. Was previously taking Metformin and a sulfonurea. Does not check her blood sugar. Does not have a meter. Previously tolerated Metformin 1000 mg bid without any issues.   Plan: Medications:  Start Metformin 1000 mg bid. Will likely need more than this, could consider adding glipizide or another oral agent in the future. Cost is currently an issue for the patient as she does not have health insurance.  Home glucose monitoring: Frequency:  N/A Instruction/counseling given: reminded to bring medications to each visit, discussed foot care, discussed the need for weight loss and discussed diet Other plans: Foot exam performed today. No issues. BMP normal. Started ACEI as well. See HTN note

## 2015-07-10 NOTE — Assessment & Plan Note (Signed)
Patient with HTN, DM type II, smoker. Based on ASCVD risk, should be on a high intensity statin medication.  -Discuss starting statin at next clinic visit.

## 2015-07-12 NOTE — Progress Notes (Signed)
Internal Medicine Clinic Attending  Case discussed with Dr. Jones at the time of the visit.  We reviewed the resident's history and exam and pertinent patient test results.  I agree with the assessment, diagnosis, and plan of care documented in the resident's note.  

## 2015-07-23 ENCOUNTER — Ambulatory Visit (INDEPENDENT_AMBULATORY_CARE_PROVIDER_SITE_OTHER): Payer: Self-pay | Admitting: Internal Medicine

## 2015-07-23 ENCOUNTER — Encounter: Payer: Self-pay | Admitting: Internal Medicine

## 2015-07-23 VITALS — BP 131/74 | HR 83 | Temp 98.2°F | Wt 268.0 lb

## 2015-07-23 DIAGNOSIS — E1165 Type 2 diabetes mellitus with hyperglycemia: Secondary | ICD-10-CM

## 2015-07-23 DIAGNOSIS — Z7984 Long term (current) use of oral hypoglycemic drugs: Secondary | ICD-10-CM

## 2015-07-23 DIAGNOSIS — I10 Essential (primary) hypertension: Secondary | ICD-10-CM

## 2015-07-23 DIAGNOSIS — E118 Type 2 diabetes mellitus with unspecified complications: Secondary | ICD-10-CM

## 2015-07-23 LAB — GLUCOSE, CAPILLARY: GLUCOSE-CAPILLARY: 188 mg/dL — AB (ref 65–99)

## 2015-07-23 NOTE — Progress Notes (Signed)
Medicine attending: Medical history, presenting problems, physical findings, and medications, reviewed with resident physician Dr Tasrif Ahmed on the day of the patient visit and I concur with his evaluation and management plan. 

## 2015-07-23 NOTE — Assessment & Plan Note (Signed)
Filed Vitals:   07/23/15 1024  BP: 131/74  Pulse: 83  Temp: 98.2 F (36.8 C)   BP well controlled today on lisinopril 10mg  daily + hctz 12.5mg  daily. Will recheck BMET today as she was started on lisinopril last visit.

## 2015-07-23 NOTE — Patient Instructions (Signed)
Keep your medications the same.   Work on getting the meter. Make appt with [place] 2 weeks after you get your meter. Check your 2 times daily.

## 2015-07-23 NOTE — Assessment & Plan Note (Signed)
Hard for me to assess her Diabetes without any meter reading. Too early to recheck hgba1c as it was checked 2 weeks ago (11.9). CBG today is 188 (297) last visit so likely she is getting better controlled after starting metformin 1000mg  bid. Still waiting for the orange card (has everything ready for Clarice Pole so hopefully will get it pretty soon).   - cont metformin 1000mg  bid for now. Asked her to call us to make appt 2 weeks after getting her meter. Asked to check sugar 2 x daily when she gets her meter.

## 2015-07-23 NOTE — Progress Notes (Signed)
   Subjective:    Patient ID: Traci Butler, female    DOB: 17-Jul-1975, 40 y.o.   MRN: [id number]  HPI  40 yo F with HTN, DM II, severe obesity, here for f/up of DM II and HTN.  DM II - does not have a meter, currently on Metformin 1000mg  BID which was started on 6/17 by Dr. Ronnald Ramp, a1c 11.9 two weeks ago. Today CBG 188 (297 last visit). Trying reduce carb intake, switched to diet soda, will try to start exercising. Having some diarrhea with metformin but getting better.   HTN - on lisinopril 10mg  daily + hctz 12.5mg  daily. Doing well, no complaints.   Has some occasional vague abdominal muscle cramping which lasts few mins and goes away, no association with food intake. Does not have it currently.   Review of Systems  Constitutional: Negative for fever and chills.  Respiratory: Negative for choking and shortness of breath.   Cardiovascular: Negative for chest pain and leg swelling.  Neurological: Negative for dizziness and numbness.       Objective:   Physical Exam  Constitutional: She is oriented to person, place, and time. She appears well-developed and well-nourished. No distress.  Obese female  HENT:  Head: Normocephalic and atraumatic.  Eyes: Conjunctivae are normal. Right eye exhibits no discharge. Left eye exhibits no discharge. No scleral icterus.  Neck: Normal range of motion.  Cardiovascular: Normal rate, regular rhythm, S1 normal, S2 normal and normal heart sounds.  Exam reveals no gallop and no friction rub.   No murmur heard. Pulmonary/Chest: Effort normal and breath sounds normal. No respiratory distress. She has no wheezes. She has no rales. She exhibits no tenderness.  Abdominal: Soft. Bowel sounds are normal. She exhibits no distension. There is no tenderness.  Musculoskeletal: Normal range of motion. She exhibits no edema or tenderness.  Neurological: She is alert and oriented to person, place, and time. She has normal strength and normal reflexes. No cranial  nerve deficit or sensory deficit.  Skin: She is not diaphoretic.  Psychiatric: She has a normal mood and affect.     Filed Vitals:   07/23/15 1024  BP: 131/74  Pulse: 83  Temp: 98.2 F (36.8 C)        Assessment & Plan:  See problem based a&p.

## 2015-07-24 LAB — BASIC METABOLIC PANEL
BUN / CREAT RATIO: 15 (ref 9–23)
BUN: 11 mg/dL (ref 6–20)
CHLORIDE: 98 mmol/L (ref 96–106)
CO2: 21 mmol/L (ref 18–29)
CREATININE: 0.71 mg/dL (ref 0.57–1.00)
Calcium: 9.4 mg/dL (ref 8.7–10.2)
GFR calc Af Amer: 124 mL/min/{1.73_m2} (ref >59)
GFR calc non Af Amer: 108 mL/min/{1.73_m2} (ref >59)
GLUCOSE: 199 mg/dL — AB (ref 65–99)
Potassium: 4.5 mmol/L (ref 3.5–5.2)
SODIUM: 136 mmol/L (ref 134–144)

## 2015-08-23 ENCOUNTER — Telehealth: Payer: Self-pay | Admitting: Internal Medicine

## 2015-08-23 NOTE — Telephone Encounter (Signed)
APT. REMINDER CALL, LMTCB °

## 2015-08-24 ENCOUNTER — Ambulatory Visit: Payer: Self-pay

## 2015-09-03 ENCOUNTER — Ambulatory Visit: Payer: Self-pay

## 2015-10-20 IMAGING — CR DG FOOT COMPLETE 3+V*R*
3 series · 3 of 3 positions shown · non-contrast
Comparison: None.

CLINICAL DATA: Twisting injury down steps.  Initial encounter.

EXAM:
RIGHT FOOT COMPLETE - 3+ VIEW

[foot ap]
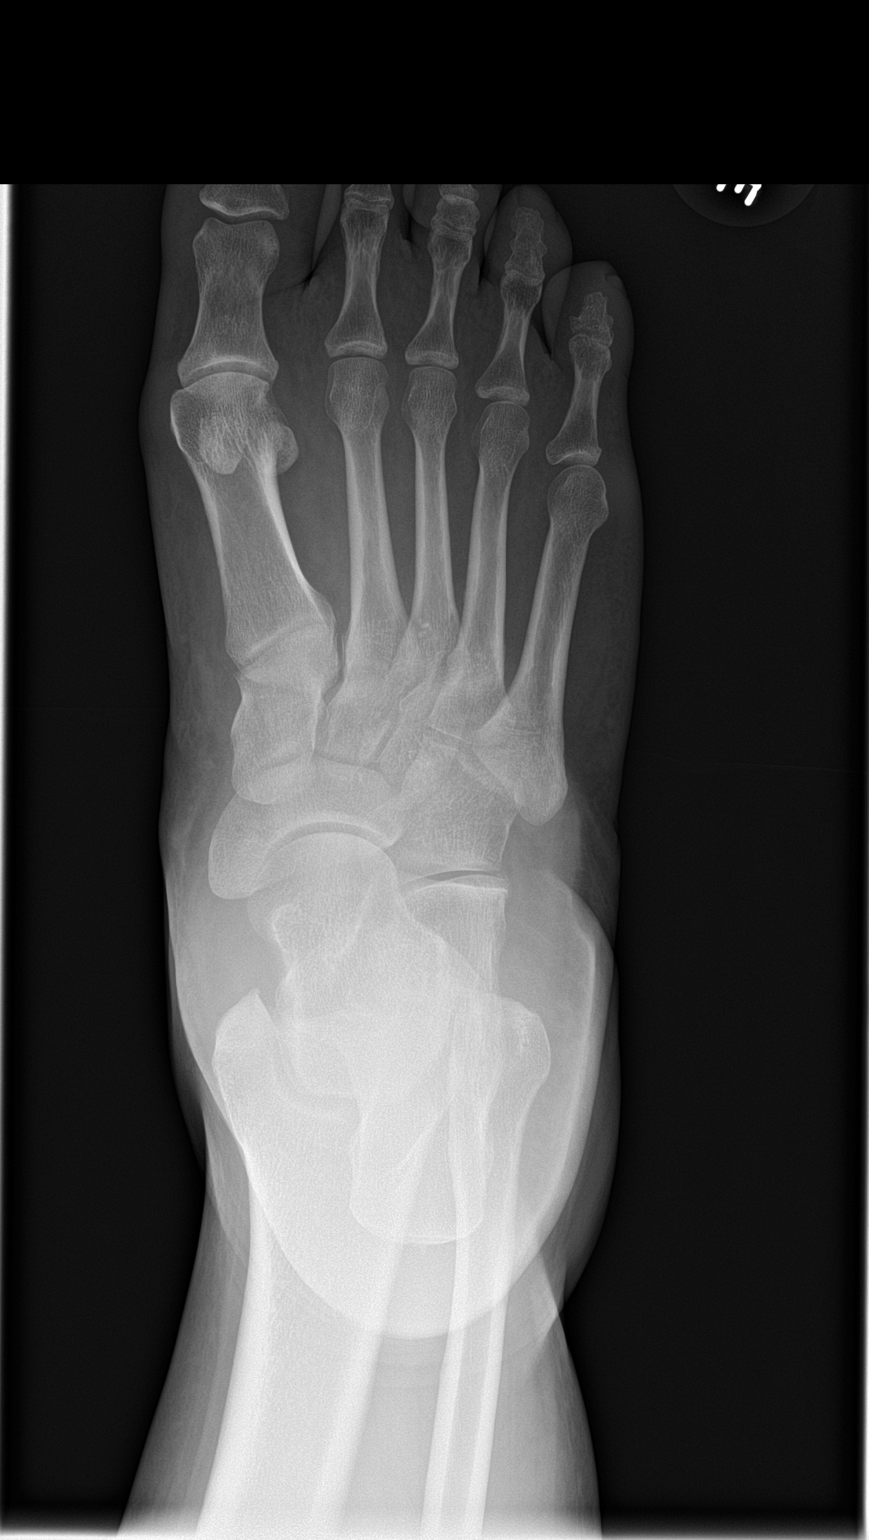

[foot obl]
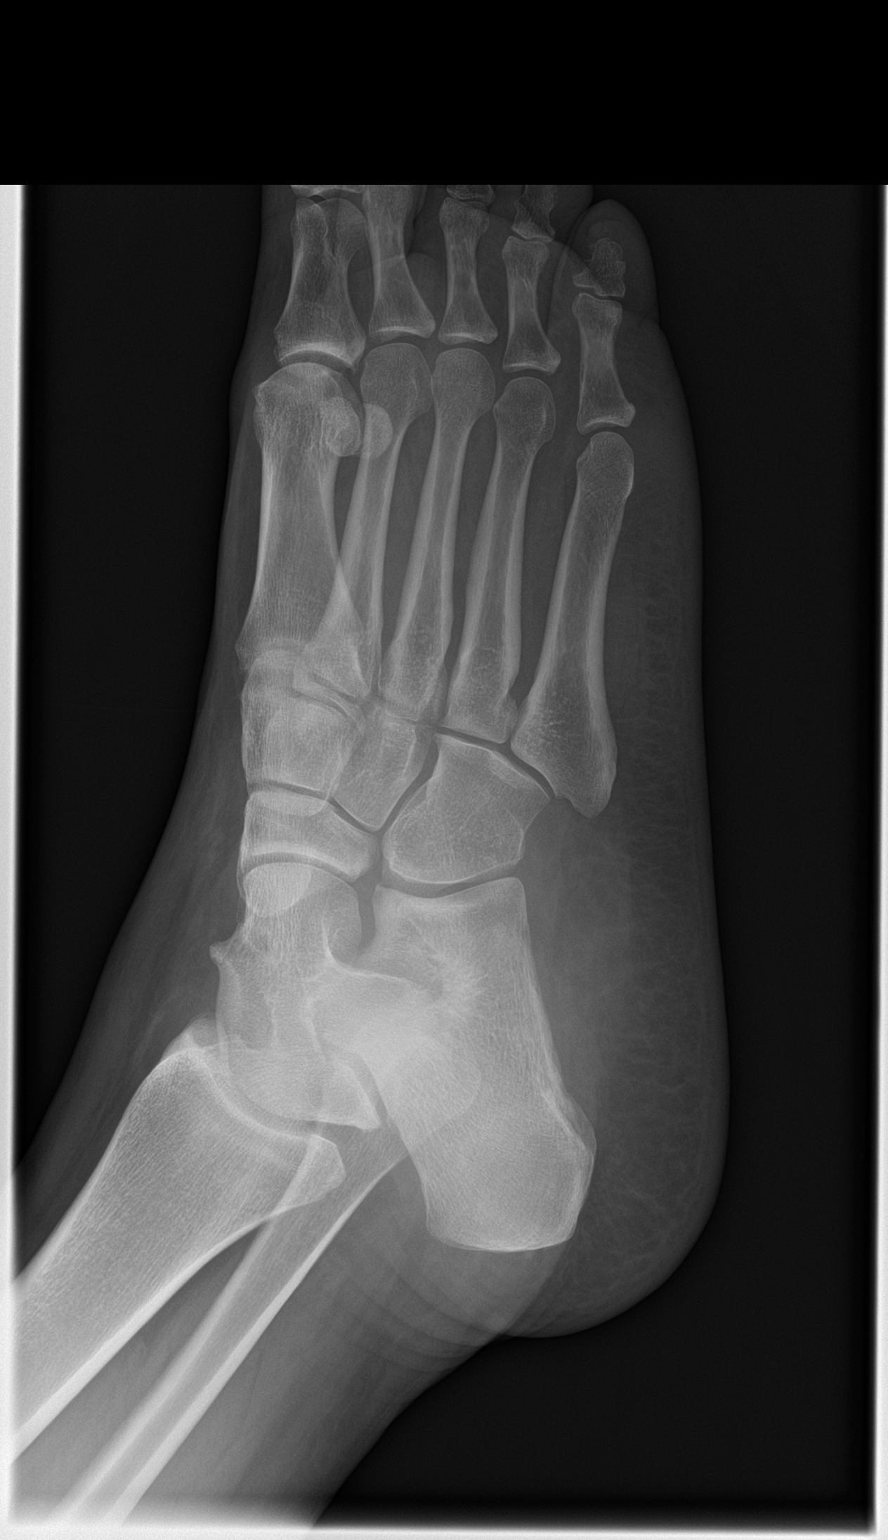

[foot lat]
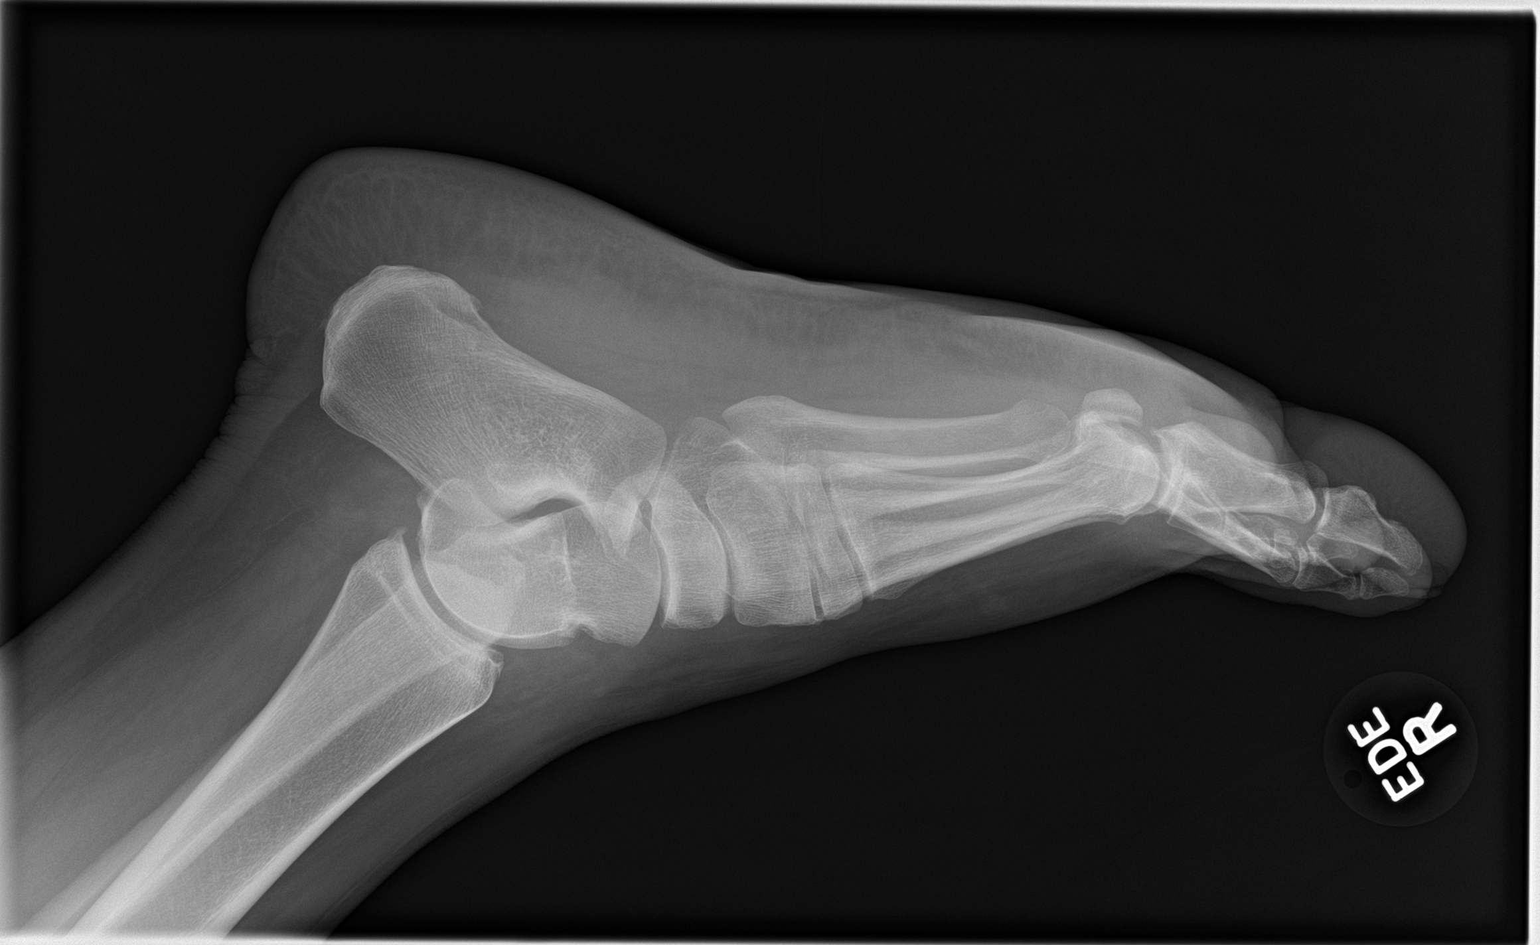

[3 of 3 positions shown; findings below may reference images not displayed]

FINDINGS: There is no evidence of fracture or dislocation. There is no
evidence of arthropathy or other focal bone abnormality. Soft
tissues are unremarkable.
IMPRESSION: Negative.

## 2015-12-30 NOTE — Progress Notes (Signed)
Old documentation

## 2016-08-11 ENCOUNTER — Encounter: Payer: Self-pay | Admitting: Internal Medicine

## 2016-08-15 ENCOUNTER — Encounter (HOSPITAL_COMMUNITY): Payer: Self-pay | Admitting: *Deleted

## 2016-08-15 ENCOUNTER — Emergency Department (HOSPITAL_COMMUNITY)
Admission: EM | Admit: 2016-08-15 | Discharge: 2016-08-15 | Disposition: A | Discharge status: Home / Self Care | Payer: Self-pay | Attending: Physician Assistant | Admitting: Physician Assistant

## 2016-08-15 DIAGNOSIS — Z79899 Other long term (current) drug therapy: Secondary | ICD-10-CM | POA: Insufficient documentation

## 2016-08-15 DIAGNOSIS — I10 Essential (primary) hypertension: Secondary | ICD-10-CM | POA: Insufficient documentation

## 2016-08-15 DIAGNOSIS — E785 Hyperlipidemia, unspecified: Secondary | ICD-10-CM | POA: Insufficient documentation

## 2016-08-15 DIAGNOSIS — F17200 Nicotine dependence, unspecified, uncomplicated: Secondary | ICD-10-CM | POA: Insufficient documentation

## 2016-08-15 DIAGNOSIS — E119 Type 2 diabetes mellitus without complications: Secondary | ICD-10-CM | POA: Insufficient documentation

## 2016-08-15 LAB — COMPREHENSIVE METABOLIC PANEL
ALK PHOS: 63 U/L (ref 38–126)
ALT: 14 U/L (ref 14–54)
AST: 16 U/L (ref 15–41)
Albumin: 4 g/dL (ref 3.5–5.0)
Anion gap: 7 (ref 5–15)
BUN: 10 mg/dL (ref 6–20)
CALCIUM: 9.3 mg/dL (ref 8.9–10.3)
CHLORIDE: 102 mmol/L (ref 101–111)
CO2: 27 mmol/L (ref 22–32)
CREATININE: 0.8 mg/dL (ref 0.44–1.00)
GFR calc Af Amer: >60 mL/min (ref >60)
Glucose, Bld: 219 mg/dL — ABNORMAL HIGH (ref 65–99)
Potassium: 3.8 mmol/L (ref 3.5–5.1)
Sodium: 136 mmol/L (ref 135–145)
Total Bilirubin: 0.5 mg/dL (ref 0.3–1.2)
Total Protein: 7.2 g/dL (ref 6.5–8.1)

## 2016-08-15 LAB — CBC WITH DIFFERENTIAL/PLATELET
BASOS PCT: 0 %
Basophils Absolute: 0 10*3/uL (ref 0.0–0.1)
EOS ABS: 0.1 10*3/uL (ref 0.0–0.7)
EOS PCT: 1 %
HCT: 38.7 % (ref 36.0–46.0)
Hemoglobin: 13 g/dL (ref 12.0–15.0)
LYMPHS ABS: 4.3 10*3/uL — AB (ref 0.7–4.0)
Lymphocytes Relative: 44 %
MCH: 27.4 pg (ref 26.0–34.0)
MCHC: 33.6 g/dL (ref 30.0–36.0)
MCV: 81.5 fL (ref 78.0–100.0)
MONOS PCT: 5 %
Monocytes Absolute: 0.5 10*3/uL (ref 0.1–1.0)
Neutro Abs: 5.1 10*3/uL (ref 1.7–7.7)
Neutrophils Relative %: 50 %
PLATELETS: 453 10*3/uL — AB (ref 150–400)
RBC: 4.75 MIL/uL (ref 3.87–5.11)
RDW: 13.3 % (ref 11.5–15.5)
WBC: 9.9 10*3/uL (ref 4.0–10.5)

## 2016-08-15 NOTE — ED Provider Notes (Signed)
[facility] DEPT Provider Note   CSN: [id number] Arrival date & time: 08/15/16  1139     History   Chief Complaint Chief Complaint  Patient presents with  . Hypertension    HPI KARN DERK is a 41 y.o. female.  HPI   Pt is a 42 yo female with ho DM and HTN presenting with HTN. Patient went to the dentist week ago and had teeth pulled, but cannot "received a good numbing medicine" because of hypertension. She went again today and was noted to be hypertensive again, she was concerned about feelingthe teeth being removed so she came here to emergency department apartment have her blood pressure control.  Of note patient has a primary care appointment tomorrow morning at 8:30 AM.  Past Medical History:  Diagnosis Date  . Diabetes mellitus without complication [facility])     Patient Active Problem List   Diagnosis Date Noted  . Severe obesity (BMI >= 40) ([place]) 07/10/2015  . Mixed hyperlipidemia due to type 2 diabetes mellitus ([place]) 07/10/2015  . Essential hypertension 07/09/2015  . Healthcare maintenance 07/09/2015  . Tobacco abuse 09/21/2010  . Diabetes mellitus, type II ([place]) 09/21/2010    Past Surgical History:  Procedure Laterality Date  . APPENDECTOMY  2008  . CESAREAN SECTION  2005   live birth  . CESAREAN SECTION  2011   live birth    OB History    No data available       Home Medications    Prior to Admission medications   Medication Sig Start Date End Date Taking? Authorizing Provider  ACCU-CHEK FASTCLIX LANCETS MISC 1 each by Does not apply route 1 day or 1 dose. 09/21/10   Hoyt Koch, MD  Blood Glucose Monitoring Suppl (ACCU-CHEK NANO SMARTVIEW) W/DEVICE KIT 1 each by Does not apply route once. 09/21/10   Hoyt Koch, MD  hydrochlorothiazide (MICROZIDE) 12.5 MG capsule Take 1 capsule (12.5 mg total) by mouth daily. 07/09/15   Corky Sox, MD  lisinopril (PRINIVIL,ZESTRIL) 10 MG tablet Take 1 tablet (10 mg total) by mouth daily.  07/09/15 07/08/16  Corky Sox, MD  metFORMIN (GLUCOPHAGE) 1000 MG tablet Take 1 tablet (1,000 mg total) by mouth 2 (two) times daily with a meal. 07/09/15   Corky Sox, MD    Family History Family History  Problem Relation Age of Onset  . Diabetes Mother   . Hypertension Father     Social History Social History  Substance Use Topics  . Smoking status: Current Some Day Smoker    Packs/day: 0.50  . Smokeless tobacco: Never Used     Comment: trying  . Alcohol use 0.0 oz/week     Allergies   Patient has no known allergies.   Review of Systems Review of Systems  Respiratory: Negative for chest tightness.   Cardiovascular: Negative for chest pain.  All other systems reviewed and are negative.    Physical Exam Updated Vital Signs BP (!) 178/93 (BP Location: Right Arm)   Pulse 67   Temp 98.9 F (37.2 C) (Oral)   Resp 20   LMP 08/15/2016 (Approximate)   SpO2 100%   Physical Exam  Constitutional: She is oriented to person, place, and time. She appears well-developed and well-nourished.  HENT:  Head: Normocephalic and atraumatic.  Eyes: Right eye exhibits no discharge. Left eye exhibits no discharge.  Cardiovascular: Normal rate.   Pulmonary/Chest: Effort normal.  Neurological: She is oriented to person, place, and time.  Skin:  Skin is warm and dry. She is not diaphoretic.  Psychiatric: She has a normal mood and affect.  Nursing note and vitals reviewed.    ED Treatments / Results  Labs (all labs ordered are listed, but only abnormal results are displayed) Labs Reviewed  COMPREHENSIVE METABOLIC PANEL  CBC WITH DIFFERENTIAL/PLATELET    EKG  EKG Interpretation None       Radiology No results found.  Procedures Procedures (including critical care time)  Medications Ordered in ED Medications - No data to display   Initial Impression / Assessment and Plan / ED Course  I have reviewed the triage vital signs and the nursing notes.  Pertinent labs  & imaging results that were available during my care of the patient were reviewed by me and considered in my medical decision making (see chart for details).     Patient is an otherwise healthy 41 year old female presenting here blood pressure control. Patient has no complaints. She has a primary care tomorrow morning 8:30 AM. Will do baseline labwork for her and then have her follow up with PCP tomorrow morning for blood pressure control.  Final Clinical Impressions(s) / ED Diagnoses   Final diagnoses:  None    New Prescriptions New Prescriptions   No medications on file     Macarthur Critchley, MD 08/15/16 [phone]

## 2016-08-15 NOTE — ED Triage Notes (Signed)
Pt here for eval for BP elevated, pt told last wk at dentist that her BP was high & came back in today for the same after going to dentist this am for a procedure that she would not allow to have done d/t BP, pt reports 3/10, pt denies blurred vision, denies n/v/d

## 2016-08-15 NOTE — Discharge Instructions (Signed)
Your labs are all reassuring. This will allow your primary care physician to start you on any hypertensive that they want to tomorrow morning. Please follow up with her primary care physician tomorrow morning as planned.

## 2016-08-15 NOTE — ED Notes (Signed)
Pt. Moved back to lobby, due to EMS taking room, pt already discharged form I connect.

## 2016-08-15 NOTE — ED Notes (Signed)
See EDP secondary assessment.  

## 2016-08-16 ENCOUNTER — Ambulatory Visit (INDEPENDENT_AMBULATORY_CARE_PROVIDER_SITE_OTHER): Payer: BLUE CROSS/BLUE SHIELD | Admitting: Internal Medicine

## 2016-08-16 ENCOUNTER — Encounter: Payer: Self-pay | Admitting: Internal Medicine

## 2016-08-16 VITALS — BP 176/85 | HR 80 | Temp 98.5°F | Ht 66.0 in | Wt 260.8 lb

## 2016-08-16 DIAGNOSIS — I10 Essential (primary) hypertension: Secondary | ICD-10-CM | POA: Diagnosis not present

## 2016-08-16 DIAGNOSIS — Z8249 Family history of ischemic heart disease and other diseases of the circulatory system: Secondary | ICD-10-CM | POA: Diagnosis not present

## 2016-08-16 DIAGNOSIS — Z7984 Long term (current) use of oral hypoglycemic drugs: Secondary | ICD-10-CM | POA: Diagnosis not present

## 2016-08-16 DIAGNOSIS — E1165 Type 2 diabetes mellitus with hyperglycemia: Secondary | ICD-10-CM

## 2016-08-16 DIAGNOSIS — E119 Type 2 diabetes mellitus without complications: Secondary | ICD-10-CM

## 2016-08-16 DIAGNOSIS — F17200 Nicotine dependence, unspecified, uncomplicated: Secondary | ICD-10-CM

## 2016-08-16 DIAGNOSIS — Z833 Family history of diabetes mellitus: Secondary | ICD-10-CM | POA: Diagnosis not present

## 2016-08-16 DIAGNOSIS — E1121 Type 2 diabetes mellitus with diabetic nephropathy: Secondary | ICD-10-CM | POA: Diagnosis not present

## 2016-08-16 DIAGNOSIS — Z79899 Other long term (current) drug therapy: Secondary | ICD-10-CM | POA: Diagnosis not present

## 2016-08-16 LAB — POCT GLYCOSYLATED HEMOGLOBIN (HGB A1C): HEMOGLOBIN A1C: 9.2

## 2016-08-16 LAB — GLUCOSE, CAPILLARY: GLUCOSE-CAPILLARY: 185 mg/dL — AB (ref 65–99)

## 2016-08-16 MED ORDER — LIRAGLUTIDE 18 MG/3ML ~~LOC~~ SOPN: 0.6000 mg | PEN_INJECTOR | Freq: Every day | SUBCUTANEOUS | 1 refills | Status: DC

## 2016-08-16 MED ORDER — LOSARTAN POTASSIUM-HCTZ 50-12.5 MG PO TABS: 1.0000 | ORAL_TABLET | Freq: Every day | ORAL | 1 refills | Status: DC

## 2016-08-16 MED ORDER — METFORMIN HCL 500 MG PO TABS: 500.0000 mg | ORAL_TABLET | Freq: Every day | ORAL | 1 refills | Status: DC

## 2016-08-16 NOTE — Progress Notes (Signed)
Medicine attending: Medical history, presenting problems, physical findings, and medications, reviewed with resident physician Dr Nina Blum on the day of the patient visit and I concur with her evaluation and management plan. 

## 2016-08-16 NOTE — Assessment & Plan Note (Addendum)
Last A1c 06/2015 11.9 and random blood glucose from an ED visit yesterday was 219. She had been on metformin 1000 mg BID but was not able to afford this for the past year because she had no insurance. She denies polydipsia, polyuria, or nausea. She wants to get back on track expresses a desire to lose weight as well. - A1c today  - Prescribed victoza - Apt with Butch Penny Plyler next week for education and injection instructions  - Metformin 500 mg daily, pt will increase this to BID as tolerated  -Follow up scheduled August 24th with PCP

## 2016-08-16 NOTE — Patient Instructions (Addendum)
[name],   For your high blood pressure start taking losartan -HCTZ 50-12.5 mg daily, I have sent this [facility] to [facility]   For you diabetes and weight management start using Victoza 0.6 mg daily for this week, next week begin taking 1.2 mg daily. Begin taking metformin 500 mg once daily, if things are going well with this after about two weeks you can begin taking the 500 mg pill twice daily. Schedule an appointment with our diabetes coordinator for Monday 7/31 so she can teach you how to inject the victoza. This is the diabetes coordinators phone number [phone]     Come to your follow up appointment on August 24th with Dr. Reesa Butler    DASH Eating Plan DASH stands for "Dietary Approaches to Stop Hypertension." The DASH eating plan is a healthy eating plan that has been shown to reduce high blood pressure (hypertension). It may also reduce your risk for type 2 diabetes, heart disease, and stroke. The DASH eating plan may also help with weight loss. What are tips for following this plan? General guidelines  Avoid eating more than 2,300 mg (milligrams) of salt (sodium) a day. If you have hypertension, you may need to reduce your sodium intake to 1,500 mg a day.  Limit alcohol intake to no more than 1 drink a day for nonpregnant women and 2 drinks a day for men. One drink equals 12 oz of beer, 5 oz of wine, or 1 oz of hard liquor.  Work with your health care provider to maintain a healthy body weight or to lose weight. Ask what an ideal weight is for you.  Get at least 30 minutes of exercise that causes your heart to beat faster (aerobic exercise) most days of the week. Activities may include walking, swimming, or biking.  Work with your health care provider or diet and nutrition specialist (dietitian) to adjust your eating plan to your individual calorie needs. Reading food labels  Check food labels for the amount of sodium per serving. Choose foods with less than 5 percent of the Daily  Value of sodium. Generally, foods with less than 300 mg of sodium per serving fit into this eating plan.  To find whole grains, look for the word "whole" as the first word in the ingredient list. Shopping  Buy products labeled as "low-sodium" or "no salt added."  Buy fresh foods. Avoid canned foods and premade or frozen meals. Cooking  Avoid adding salt when cooking. Use salt-free seasonings or herbs instead of table salt or sea salt. Check with your health care provider or pharmacist before using salt substitutes.  Do not fry foods. Cook foods using healthy methods such as baking, boiling, grilling, and broiling instead.  Cook with heart-healthy oils, such as olive, canola, soybean, or sunflower oil. Meal planning   Eat a balanced diet that includes: ? 5 or more servings of fruits and vegetables each day. At each meal, try to fill half of your plate with fruits and vegetables. ? Up to 6-8 servings of whole grains each day. ? Less than 6 oz of lean meat, poultry, or fish each day. A 3-oz serving of meat is about the same size as a deck of cards. One egg equals 1 oz. ? 2 servings of low-fat dairy each day. ? A serving of nuts, seeds, or beans 5 times each week. ? Heart-healthy fats. Healthy fats called Omega-3 fatty acids are found in foods such as flaxseeds and coldwater fish, like sardines, salmon, and  mackerel.  Limit how much you eat of the following: ? Canned or prepackaged foods. ? Food that is high in trans fat, such as fried foods. ? Food that is high in saturated fat, such as fatty meat. ? Sweets, desserts, sugary drinks, and other foods with added sugar. ? Full-fat dairy products.  Do not salt foods before eating.  Try to eat at least 2 vegetarian meals each week.  Eat more home-cooked food and less restaurant, buffet, and fast food.  When eating at a restaurant, ask that your food be prepared with less salt or no salt, if possible. What foods are recommended? The  items listed may not be a complete list. Talk with your dietitian about what dietary choices are best for you. Grains Whole-grain or whole-wheat bread. Whole-grain or whole-wheat pasta. Brown rice. [place] Morrow. Bulgur. Whole-grain and low-sodium cereals. Pita bread. Low-fat, low-sodium crackers. Whole-wheat flour tortillas. Vegetables Fresh or frozen vegetables (raw, steamed, roasted, or grilled). Low-sodium or reduced-sodium tomato and vegetable juice. Low-sodium or reduced-sodium tomato sauce and tomato paste. Low-sodium or reduced-sodium canned vegetables. Fruits All fresh, dried, or frozen fruit. Canned fruit in natural juice (without added sugar). Meat and other protein foods Skinless chicken or [place]. Ground chicken or [place]. Pork with fat trimmed off. Fish and seafood. Egg whites. Dried beans, peas, or lentils. Unsalted nuts, nut butters, and seeds. Unsalted canned beans. Lean cuts of beef with fat trimmed off. Low-sodium, lean deli meat. Dairy Low-fat (1%) or fat-free (skim) milk. Fat-free, low-fat, or reduced-fat cheeses. Nonfat, low-sodium ricotta or cottage cheese. Low-fat or nonfat yogurt. Low-fat, low-sodium cheese. Fats and oils Soft margarine without trans fats. Vegetable oil. Low-fat, reduced-fat, or light mayonnaise and salad dressings (reduced-sodium). Canola, safflower, olive, soybean, and sunflower oils. Avocado. Seasoning and other foods Herbs. Spices. Seasoning mixes without salt. Unsalted popcorn and pretzels. Fat-free sweets. What foods are not recommended? The items listed may not be a complete list. Talk with your dietitian about what dietary choices are best for you. Grains Baked goods made with fat, such as croissants, muffins, or some breads. Dry pasta or rice meal packs. Vegetables Creamed or fried vegetables. Vegetables in a cheese sauce. Regular canned vegetables (not low-sodium or reduced-sodium). Regular canned tomato sauce and paste (not low-sodium or  reduced-sodium). Regular tomato and vegetable juice (not low-sodium or reduced-sodium). Traci Butler. Olives. Fruits Canned fruit in a light or heavy syrup. Fried fruit. Fruit in cream or butter sauce. Meat and other protein foods Fatty cuts of meat. Ribs. Fried meat. Traci Butler. Sausage. Bologna and other processed lunch meats. Salami. Fatback. Hotdogs. Bratwurst. Salted nuts and seeds. Canned beans with added salt. Canned or smoked fish. Whole eggs or egg yolks. Chicken or [place] with skin. Dairy Whole or 2% milk, cream, and half-and-half. Whole or full-fat cream cheese. Whole-fat or sweetened yogurt. Full-fat cheese. Nondairy creamers. Whipped toppings. Processed cheese and cheese spreads. Fats and oils Butter. Stick margarine. Lard. Shortening. Ghee. Bacon fat. Tropical oils, such as coconut, palm kernel, or palm oil. Seasoning and other foods Salted popcorn and pretzels. Onion salt, garlic salt, seasoned salt, table salt, and sea salt. Worcestershire sauce. Tartar sauce. Barbecue sauce. Teriyaki sauce. Soy sauce, including reduced-sodium. Steak sauce. Canned and packaged gravies. Fish sauce. Oyster sauce. Cocktail sauce. Horseradish that you find on the shelf. Ketchup. Mustard. Meat flavorings and tenderizers. Bouillon cubes. Hot sauce and Tabasco sauce. Premade or packaged marinades. Premade or packaged taco seasonings. Relishes. Regular salad dressings. Where to find more information:  National Heart, Lung,  and Blood Institute: https://wilson-eaton.com/  American Heart Association: www.heart.org Summary  The DASH eating plan is a healthy eating plan that has been shown to reduce high blood pressure (hypertension). It may also reduce your risk for type 2 diabetes, heart disease, and stroke.  With the DASH eating plan, you should limit salt (sodium) intake to 2,300 mg a day. If you have hypertension, you may need to reduce your sodium intake to 1,500 mg a day.  When on the DASH eating plan, aim to eat more  fresh fruits and vegetables, whole grains, lean proteins, low-fat dairy, and heart-healthy fats.  Work with your health care provider or diet and nutrition specialist (dietitian) to adjust your eating plan to your individual calorie needs. This information is not intended to replace advice given to you by your health care provider. Make sure you discuss any questions you have with your health care provider. Document Released: 12/29/2010 Document Revised: 01/03/2016 Document Reviewed: 01/03/2016 Elsevier Interactive Patient Education  2017 [employer].

## 2016-08-16 NOTE — Progress Notes (Addendum)
   CC: blood pressure check  HPI:  [name] is a 41 y.o. with PMH as listed below who presents for follow up of hypertension and diabetes mellitus. Please see the assessment and plans for the status of the patient chronic medical problems.    Past Medical History:  Diagnosis Date  . Diabetes mellitus without complication ([place])    Review of Systems: Refer to history of present illness and assessment and plans for pertinent review of systems, all others reviewed and negative  Physical Exam:  Vitals:   08/16/16 0843  BP: (!) 176/85  Pulse: 80  Temp: 98.5 F (36.9 C)  TempSrc: Oral  SpO2: 100%  Weight: 260 lb 12.8 oz (118.3 kg)  Height: 5\' 6"  (1.676 m)   Physical Exam  Constitutional: She appears well-developed and well-nourished. No distress.  Cardiovascular: Normal rate and regular rhythm.   No murmur heard. Pulmonary/Chest: Effort normal. No respiratory distress. She has no wheezes. She has no rales.  Skin: She is not diaphoretic.     Assessment & Plan:   Hypertension  BP Readings from Last 3 Encounters:  08/16/16 (!) 176/85  08/15/16 (!) 169/90  07/23/15 131/74   Patient is here today for blood pressure check. She is having headache, denies changes in vision. She has not been on blood pressure medication for the past year because she was uninsured and couldn't afford the medications and was told at recent dental that her blood pressure was high. In the ED yesterday a BMP showed potassium and sodium within the normal limit.  Assessment: Stage II HTN, will start two therapies today in combination to encourage compliance. Consider switching to single therapy pills not including HCTZ if diabetes becomes difficult to control.  -  Prescribed losartan-HCTZ 50-12.5 mg daily - counseled on weight loss, physical activity and DASH diet   Diabetes, uncontrolled  Last A1c 06/2015 11.9 and random blood glucose from an ED visit yesterday was 219. She had been on metformin  1000 mg BID but was not able to afford this for the past year because she had no insurance. She denies polydipsia, polyuria, or nausea. She wants to get back on track expresses a desire to lose weight as well. - A1c today  - Prescribed victoza - Apt with Butch Penny Plyler next week for education and injection instructions  - Metformin 500 mg daily, pt will increase this to BID as tolerated  -Follow up scheduled August 24th with PCP   ADDENDUM: A1c is 9.2, I called to communicate these results to the patient, she says that she has started using both the metformin and victoza without problems.   See Encounters Tab for problem based charting.  Patient discussed with Dr. Beryle Beams

## 2016-08-16 NOTE — Assessment & Plan Note (Signed)
BP Readings from Last 3 Encounters:  08/16/16 (!) 176/85  08/15/16 (!) 169/90  07/23/15 131/74   Patient is here today for blood pressure check. She is having headache, denies changes in vision. She has not been on blood pressure medication for the past year because she was uninsured and couldn't afford the medications and was told at recent dental that her blood pressure was high. In the ED yesterday a BMP showed potassium and sodium within the normal limit.  Assessment: Stage II HTN, will start two therapies today in combination to encourage compliance. Consider switching to single therapy pills not including HCTZ if diabetes becomes difficult to control.  -  Prescribed losartan-HCTZ 50-12.5 mg daily - counseled on weight loss, physical activity and DASH diet

## 2016-08-21 ENCOUNTER — Other Ambulatory Visit: Payer: Self-pay | Admitting: Dietician

## 2016-08-21 ENCOUNTER — Ambulatory Visit (INDEPENDENT_AMBULATORY_CARE_PROVIDER_SITE_OTHER): Payer: BLUE CROSS/BLUE SHIELD | Admitting: Dietician

## 2016-08-21 DIAGNOSIS — Z713 Dietary counseling and surveillance: Secondary | ICD-10-CM | POA: Diagnosis not present

## 2016-08-21 DIAGNOSIS — E118 Type 2 diabetes mellitus with unspecified complications: Secondary | ICD-10-CM

## 2016-08-21 MED ORDER — CONTOUR NEXT ONE KIT: 1.0000 | PACK | Freq: Two times a day (BID) | 1 refills | Status: AC

## 2016-08-21 MED ORDER — GLUCOSE BLOOD VI STRP: ORAL_STRIP | 9 refills | Status: AC

## 2016-08-21 MED ORDER — BAYER MICROLET LANCETS MISC: 9 refills | Status: AC

## 2016-08-21 MED ORDER — PEN NEEDLES 32G X 4 MM MISC: 1.0000 | Freq: Every day | 4 refills | Status: DC

## 2016-08-21 NOTE — Patient Instructions (Addendum)
Inject 0.6 mg Victoza one time each day- morning is fine.  If you continue with stomach upset with metformin- you can try taking it with food.  Good Job limiting regular soda and juices. That should help your health and your blood sugar a lot!  Diet soda can be mixed with seltzer water if it is too sweet.   Drink mostly water- at least 4-6 cups a day, 2 cups of milk and 1-2 cups each of tea and coffee, and maybe 1/2 cup juice per day.  Call anytime you have diabetes or nutrition questions. [facility] [address].    I think your insurance may cover more visits, but suggest you call to see. The phone number to call is on the back of your insurance card. If you need help, please call me.   Butch Penny [phone]

## 2016-08-21 NOTE — Progress Notes (Signed)
  Medical Nutrition Therapy:  Appt start time: 0808 end time:  0855. Visit # 1(last visit 2014)  Assessment:  Primary concerns today: taking Victoza, machine to check blood sugar Ms. Rezek is here to learn about Victoza per her doctor's request. She also has meal planning questions and needs pen needles and a meter.  Preferred Learning Style: No preference indicated  Learning Readiness: Ready and change in progress  ANTHROPOMETRICS: weight-260#, BMI-42 WEIGHT HISTORY: decrease 16# n past year, 19# in past 6 years, Highest: 281.7# Lowest- current SLEEP:need to assess at future visit MEDICATIONS: metformin 500 mg- had loose stools but better, took victoza here in office BLOOD SUGAR:CBG not done today, A1C 9.2% = to ~215-220 average blood sugar DIETARY INTAKE: Asking about how to fit juice into her meal plan. Is trying to drink only diet soda instead of regular soda, eat less fried foods and more vegtables Usual physical activity: ADLs  Estimated daily energy needs: ~1800 calories Target ~200+/- 20 g carbohydrates  Progress Towards Goal(s):  In progress.   Nutritional Diagnosis:  NB-1.1 Food and nutrition-related knowledge deficit As related to lack of sufficient prior diabetes training.  As evidenced by her questions and report..    Intervention:  Nutrition education about juices. Reading labels, limits and spreading carbs throughout the day. Action, side effects and how to use Victoza pen, gave hr a Contour next One meter, 10 strips, 10 lancets and 10 32g x58mm pen needles and did set up on her meter for her. Coordination of care: needs prescriptions for open needles, test strips [facility]ets [facility] [address]. This is the onel y  Teaching Method Utilized: Visual, Auditory, Hands on Handouts given during visit include: Victoza, Meal planning book Barriers to learning/adherence to lifestyle change: competing values, material resources Demonstrated degree of understanding via:   Teach Back   Monitoring/Evaluation:  Dietary intake, exercise, meter or blood sugars, and body weight prn.(Ms. Grace did not seem interested in follow up- also needs to check to see if her insurance covers MNT.  Tnya Ades, Butch Penny, [place] 08/21/2016 9:33 AM.

## 2016-08-21 NOTE — Telephone Encounter (Signed)
Gave her a sample Contour Next One meter today- she needs supplies and pen needles to inject her Victoza. ( Gave her 10 needles today samples). She only [facility] to [facility] on [address]. Will remove others from her profile.

## 2016-09-15 ENCOUNTER — Encounter: Payer: Self-pay | Admitting: Internal Medicine

## 2016-10-19 ENCOUNTER — Ambulatory Visit (INDEPENDENT_AMBULATORY_CARE_PROVIDER_SITE_OTHER): Payer: BLUE CROSS/BLUE SHIELD | Admitting: Internal Medicine

## 2016-10-19 ENCOUNTER — Encounter (INDEPENDENT_AMBULATORY_CARE_PROVIDER_SITE_OTHER): Payer: Self-pay

## 2016-10-19 VITALS — BP 123/71 | HR 83 | Temp 98.8°F | Ht 62.0 in | Wt 252.4 lb

## 2016-10-19 DIAGNOSIS — J069 Acute upper respiratory infection, unspecified: Secondary | ICD-10-CM | POA: Insufficient documentation

## 2016-10-19 DIAGNOSIS — Z720 Tobacco use: Secondary | ICD-10-CM

## 2016-10-19 DIAGNOSIS — E118 Type 2 diabetes mellitus with unspecified complications: Secondary | ICD-10-CM

## 2016-10-19 DIAGNOSIS — Z79899 Other long term (current) drug therapy: Secondary | ICD-10-CM | POA: Diagnosis not present

## 2016-10-19 DIAGNOSIS — E782 Mixed hyperlipidemia: Secondary | ICD-10-CM

## 2016-10-19 DIAGNOSIS — I10 Essential (primary) hypertension: Secondary | ICD-10-CM | POA: Diagnosis not present

## 2016-10-19 DIAGNOSIS — F17210 Nicotine dependence, cigarettes, uncomplicated: Secondary | ICD-10-CM

## 2016-10-19 DIAGNOSIS — E1169 Type 2 diabetes mellitus with other specified complication: Secondary | ICD-10-CM

## 2016-10-19 DIAGNOSIS — Z7984 Long term (current) use of oral hypoglycemic drugs: Secondary | ICD-10-CM | POA: Diagnosis not present

## 2016-10-19 DIAGNOSIS — Z0000 Encounter for general adult medical examination without abnormal findings: Secondary | ICD-10-CM

## 2016-10-19 LAB — POCT GLYCOSYLATED HEMOGLOBIN (HGB A1C): HEMOGLOBIN A1C: 8.3

## 2016-10-19 LAB — GLUCOSE, CAPILLARY: Glucose-Capillary: 121 mg/dL — ABNORMAL HIGH (ref 65–99)

## 2016-10-19 MED ORDER — LIRAGLUTIDE 18 MG/3ML ~~LOC~~ SOPN: 0.6000 mg | PEN_INJECTOR | Freq: Every day | SUBCUTANEOUS | 1 refills | Status: DC

## 2016-10-19 MED ORDER — BENZONATATE 100 MG PO CAPS: 100.0000 mg | ORAL_CAPSULE | Freq: Three times a day (TID) | ORAL | 1 refills | Status: DC | PRN

## 2016-10-19 MED ORDER — METFORMIN HCL 500 MG PO TABS: 500.0000 mg | ORAL_TABLET | Freq: Two times a day (BID) | ORAL | 1 refills | Status: DC

## 2016-10-19 MED ORDER — PEN NEEDLES 32G X 4 MM MISC: 1.0000 | Freq: Every day | 4 refills | Status: AC

## 2016-10-19 MED ORDER — LOSARTAN POTASSIUM-HCTZ 50-12.5 MG PO TABS: 1.0000 | ORAL_TABLET | Freq: Every day | ORAL | 0 refills | Status: DC

## 2016-10-19 NOTE — Patient Instructions (Addendum)
- It was a pleasure seeing you today - Please follow up with me in 3 months - I have increased your metformin to 500 mg twice daily - Continue with the victoza - I have refilled your medications - We have checked your blood work today - Please try to stop smoking - Please call with any questions   Upper Respiratory Infection, Adult Most upper respiratory infections (URIs) are a viral infection of the air passages leading to the lungs. A URI affects the nose, throat, and upper air passages. The most common type of URI is nasopharyngitis and is typically referred to as "the common cold." URIs run their course and usually go away on their own. Most of the time, a URI does not require medical attention, but sometimes a bacterial infection in the upper airways can follow a viral infection. This is called a secondary infection. Sinus and middle ear infections are common types of secondary upper respiratory infections. Bacterial pneumonia can also complicate a URI. A URI can worsen asthma and chronic obstructive pulmonary disease (COPD). Sometimes, these complications can require emergency medical care and may be life threatening. What are the causes? Almost all URIs are caused by viruses. A virus is a type of germ and can spread from one person to another. What increases the risk? You may be at risk for a URI if:  You smoke.  You have chronic heart or lung disease.  You have a weakened defense (immune) system.  You are very young or very old.  You have nasal allergies or asthma.  You work in crowded or poorly ventilated areas.  You work in health care facilities or schools.  What are the signs or symptoms? Symptoms typically develop 2-3 days after you come in contact with a cold virus. Most viral URIs last 7-10 days. However, viral URIs from the influenza virus (flu virus) can last 14-18 days and are typically more severe. Symptoms may include:  Runny or stuffy (congested)  nose.  Sneezing.  Cough.  Sore throat.  Headache.  Fatigue.  Fever.  Loss of appetite.  Pain in your forehead, behind your eyes, and over your cheekbones (sinus pain).  Muscle aches.  How is this diagnosed? Your health care provider may diagnose a URI by:  Physical exam.  Tests to check that your symptoms are not due to another condition such as: ? Strep throat. ? Sinusitis. ? Pneumonia. ? Asthma.  How is this treated? A URI goes away on its own with time. It cannot be cured with medicines, but medicines may be prescribed or recommended to relieve symptoms. Medicines may help:  Reduce your fever.  Reduce your cough.  Relieve nasal congestion.  Follow these instructions at home:  Take medicines only as directed by your health care provider.  Gargle warm saltwater or take cough drops to comfort your throat as directed by your health care provider.  Use a warm mist humidifier or inhale steam from a shower to increase air moisture. This may make it easier to breathe.  Drink enough fluid to keep your urine clear or pale yellow.  Eat soups and other clear broths and maintain good nutrition.  Rest as needed.  Return to work when your temperature has returned to normal or as your health care provider advises. You may need to stay home longer to avoid infecting others. You can also use a face mask and careful hand washing to prevent spread of the virus.  Increase the usage of your inhaler  if you have asthma.  Do not use any tobacco products, including cigarettes, chewing tobacco, or electronic cigarettes. If you need help quitting, ask your health care provider. How is this prevented? The best way to protect yourself from getting a cold is to practice good hygiene.  Avoid oral or hand contact with people with cold symptoms.  Wash your hands often if contact occurs.  There is no clear evidence that vitamin C, vitamin E, echinacea, or exercise reduces the chance  of developing a cold. However, it is always recommended to get plenty of rest, exercise, and practice good nutrition. Contact a health care provider if:  You are getting worse rather than better.  Your symptoms are not controlled by medicine.  You have chills.  You have worsening shortness of breath.  You have brown or red mucus.  You have yellow or brown nasal discharge.  You have pain in your face, especially when you bend forward.  You have a fever.  You have swollen neck glands.  You have pain while swallowing.  You have white areas in the back of your throat. Get help right away if:  You have severe or persistent: ? Headache. ? Ear pain. ? Sinus pain. ? Chest pain.  You have chronic lung disease and any of the following: ? Wheezing. ? Prolonged cough. ? Coughing up blood. ? A change in your usual mucus.  You have a stiff neck.  You have changes in your: ? Vision. ? Hearing. ? Thinking. ? Mood. This information is not intended to replace advice given to you by your health care provider. Make sure you discuss any questions you have with your health care provider. Document Released: 07/05/2000 Document Revised: 09/12/2015 Document Reviewed: 04/16/2013 Elsevier Interactive Patient Education  2017 [employer].

## 2016-10-20 LAB — LIPID PANEL
CHOL/HDL RATIO: 4.8 ratio — AB (ref 0.0–4.4)
Cholesterol, Total: 143 mg/dL (ref 100–199)
HDL: 30 mg/dL — ABNORMAL LOW (ref >39)
LDL CALC: 75 mg/dL (ref 0–99)
TRIGLYCERIDES: 188 mg/dL — AB (ref 0–149)
VLDL Cholesterol Cal: 38 mg/dL (ref 5–40)

## 2016-10-20 LAB — BMP8+ANION GAP
Anion Gap: 12 mmol/L (ref 10.0–18.0)
BUN / CREAT RATIO: 12 (ref 9–23)
BUN: 8 mg/dL (ref 6–24)
CO2: 23 mmol/L (ref 20–29)
CREATININE: 0.65 mg/dL (ref 0.57–1.00)
Calcium: 8.7 mg/dL (ref 8.7–10.2)
Chloride: 104 mmol/L (ref 96–106)
GFR, EST AFRICAN AMERICAN: 128 mL/min/{1.73_m2} (ref >59)
GFR, EST NON AFRICAN AMERICAN: 111 mL/min/{1.73_m2} (ref >59)
Glucose: 133 mg/dL — ABNORMAL HIGH (ref 65–99)
POTASSIUM: 4.1 mmol/L (ref 3.5–5.2)
SODIUM: 139 mmol/L (ref 134–144)

## 2016-10-20 NOTE — Assessment & Plan Note (Signed)
-   Patient is still an active smoker - She smokes 1 pack every 3 days - She states she will try and quit on her own and does not want anything to help her quit at this time - Will follow up on next visit

## 2016-10-20 NOTE — Assessment & Plan Note (Signed)
BP Readings from Last 3 Encounters:  10/19/16 123/71  08/16/16 (!) 176/85  08/15/16 (!) 169/90    Lab Results  Component Value Date   NA 139 10/19/2016   K 4.1 10/19/2016   CREATININE 0.65 10/19/2016    Assessment: Blood pressure control:  well controlled Progress toward BP goal:   at goal Comments: Patient is compliant with losartan/HCTZ 50/12.5 mg  Plan: Medications:  continue current medications Educational resources provided: brochure (denies need ) Self management tools provided:   Other plans: BMP checked today.

## 2016-10-20 NOTE — Assessment & Plan Note (Signed)
Lab Results  Component Value Date   HGBA1C 8.3 10/19/2016   HGBA1C 9.2 08/16/2016   HGBA1C 11.9 07/09/2015     Assessment: Diabetes control:  fair Progress toward A1C goal:   improved Comments: patient is compliant with victoza 0.6 mg and metformin 500 mg once daily  Plan: Medications:  Will increase metformin to 500 mg bid and consider increasing it on next visit to 1000 mg bid Home glucose monitoring: Frequency:   Timing:   Instruction/counseling given: reminded to bring blood glucose meter & log to each visit and discussed the need for weight loss Educational resources provided: brochure (denies need ) Self management tools provided:   Other plans: BMP checked today. Patient will need to follow up for eye exam. Foot exam done today

## 2016-10-20 NOTE — Assessment & Plan Note (Signed)
-   Will need flu shot and PNA vaccine on follow up - Will obtain pap smear on follow up

## 2016-10-20 NOTE — Assessment & Plan Note (Signed)
-   Patient with cough, chills and rhinorrhea over the last 2 days - Patient with likely viral URI - Will prescribe tesalon perles for her cough. - Explained that she should pursue conservative measures like anti histamines, warm showers, saline nasal spray - Patient instructed to follow up if she has no improvement or if her symptoms worsen

## 2016-10-20 NOTE — Assessment & Plan Note (Signed)
-   Patient with a 10 year ASCVD risk score of 8.5% based on her lipid panel and risk factors - Will start a high intensity statin on follow up after discussion with patient

## 2016-10-20 NOTE — Progress Notes (Signed)
   Subjective:    Patient ID: Traci Butler, female    DOB: 12/20/1975, 41 y.o.   MRN: [id number]  HPI  I have seen and examined this patient. She is here for routine follow up of her HTN, DM.  Patient complains of dry cough and rhinorrhea over the last 2-3 days with some chills. She denies fevers, no sore throat. She feels well otherwise and is compliant with her medications.   Review of Systems  Constitutional: Positive for chills and fatigue. Negative for activity change, appetite change and fever.  HENT: Negative.   Respiratory: Negative.   Cardiovascular: Negative.   Gastrointestinal: Negative.   Musculoskeletal: Negative.   Skin: Negative.   Neurological: Negative.   Psychiatric/Behavioral: Negative.        Objective:   Physical Exam  Constitutional: She is oriented to person, place, and time. She appears well-developed and well-nourished.  HENT:  Head: Normocephalic and atraumatic.  Mouth/Throat: No oropharyngeal exudate.  Neck: Neck supple.  Cardiovascular: Normal rate, regular rhythm and normal heart sounds.   Pulmonary/Chest: Effort normal and breath sounds normal. No respiratory distress. She has no wheezes.  Abdominal: Soft. Bowel sounds are normal. She exhibits no distension. There is no tenderness.  Musculoskeletal: Normal range of motion. She exhibits no edema.  Lymphadenopathy:    She has no cervical adenopathy.  Neurological: She is alert and oriented to person, place, and time.  Skin: Skin is warm. No rash noted. No erythema.  Psychiatric: She has a normal mood and affect. Her behavior is normal.          Assessment & Plan:   Please see problem based charting for assessment and plan:

## 2016-10-23 ENCOUNTER — Telehealth: Payer: Self-pay | Admitting: Internal Medicine

## 2016-10-23 DIAGNOSIS — E1169 Type 2 diabetes mellitus with other specified complication: Secondary | ICD-10-CM

## 2016-10-23 DIAGNOSIS — E782 Mixed hyperlipidemia: Principal | ICD-10-CM

## 2016-10-23 MED ORDER — ROSUVASTATIN CALCIUM 20 MG PO TABS: 20.0000 mg | ORAL_TABLET | Freq: Every day | ORAL | 0 refills | Status: DC

## 2016-10-23 NOTE — Telephone Encounter (Signed)
I called patient today to discuss results of her blood work. I spent the patient that her kidney function was within normal limits. Her A1c had improved to 8.3. She will increase her metformin to 500 mg twice daily and continue with her Victoza. I also explained to the patient that she had an elevated ASCVD risk score with a 10 year risk of 8.5%. We will start her on a high intensity statin. Patient expresses understanding and is in agreement with plan. She was instructed to call internal medicine clinic if she has any issues obtaining this medication or if she has any side effects from it.

## 2016-11-03 ENCOUNTER — Encounter: Payer: BLUE CROSS/BLUE SHIELD | Admitting: Internal Medicine

## 2017-01-22 ENCOUNTER — Emergency Department (HOSPITAL_COMMUNITY)
Admission: EM | Admit: 2017-01-22 | Discharge: 2017-01-22 | Disposition: A | Discharge status: Home / Self Care | Payer: BLUE CROSS/BLUE SHIELD | Attending: Emergency Medicine | Admitting: Emergency Medicine

## 2017-01-22 ENCOUNTER — Other Ambulatory Visit: Payer: Self-pay

## 2017-01-22 ENCOUNTER — Emergency Department (HOSPITAL_COMMUNITY): Payer: BLUE CROSS/BLUE SHIELD

## 2017-01-22 ENCOUNTER — Encounter (HOSPITAL_COMMUNITY): Payer: Self-pay

## 2017-01-22 DIAGNOSIS — R05 Cough: Secondary | ICD-10-CM | POA: Diagnosis not present

## 2017-01-22 DIAGNOSIS — Z7984 Long term (current) use of oral hypoglycemic drugs: Secondary | ICD-10-CM | POA: Diagnosis not present

## 2017-01-22 DIAGNOSIS — R6889 Other general symptoms and signs: Secondary | ICD-10-CM

## 2017-01-22 DIAGNOSIS — I10 Essential (primary) hypertension: Secondary | ICD-10-CM | POA: Insufficient documentation

## 2017-01-22 DIAGNOSIS — R6883 Chills (without fever): Secondary | ICD-10-CM | POA: Insufficient documentation

## 2017-01-22 DIAGNOSIS — E119 Type 2 diabetes mellitus without complications: Secondary | ICD-10-CM | POA: Insufficient documentation

## 2017-01-22 DIAGNOSIS — F17210 Nicotine dependence, cigarettes, uncomplicated: Secondary | ICD-10-CM | POA: Insufficient documentation

## 2017-01-22 DIAGNOSIS — Z79899 Other long term (current) drug therapy: Secondary | ICD-10-CM | POA: Insufficient documentation

## 2017-01-22 DIAGNOSIS — R51 Headache: Secondary | ICD-10-CM | POA: Insufficient documentation

## 2017-01-22 HISTORY — DX: Pure hypercholesterolemia, unspecified: E78.00

## 2017-01-22 HISTORY — DX: Essential (primary) hypertension: I10

## 2017-01-22 LAB — CBG MONITORING, ED: Glucose-Capillary: 150 mg/dL — ABNORMAL HIGH (ref 65–99)

## 2017-01-22 MED ORDER — BENZONATATE 100 MG PO CAPS: 100.0000 mg | ORAL_CAPSULE | Freq: Three times a day (TID) | ORAL | 0 refills | Status: DC

## 2017-01-22 NOTE — ED Notes (Signed)
Pt is alert and oriented x 4 and is verbally responsive. Pt is c/o generalized body aches. Pt reports a non productive cough. Pt is accompanied with sig. other. Traci Butler

## 2017-01-22 NOTE — ED Triage Notes (Addendum)
Patient c/o chills, generalized body aches, and a non productive cough x 4 days. Patient states she has been taking OTC Robitussin.  Patient also reports that she did not take her prescribed BP meds today.

## 2017-01-22 NOTE — ED Provider Notes (Signed)
[facility] DEPT Provider Note   CSN: [id number] Arrival date & time: 01/22/17  1135     History   Chief Complaint Chief Complaint  Patient presents with  . Chills  . Generalized Body Aches  . Cough    HPI Traci Butler is a 41 y.o. female.  HPI   41 year old female with history of diabetes, hypertension, hypercholesterolemia presenting for evaluation of flulike symptoms.  Patient report for the past 5 days she has had subjective fever, chills, body aches, generalized weakness, headache, sinus congestion, cough productive with yellow sputum, and sneezing.  Symptom has been persistent not improve despite using over-the-counter medication such as TheraFlu, and Tylenol.  She is a smoker and occasional drinker.  She denies any recent sick contact.  She did not have a flu shot.  Denies any recent travel.  Did endorse some nausea and vomiting earlier today but no diarrhea or constipation.  No abdominal pain back pain or rash.  Past Medical History:  Diagnosis Date  . Diabetes mellitus without complication ([place])   . High cholesterol   . Hypertension     Patient Active Problem List   Diagnosis Date Noted  . URI (upper respiratory infection) 10/19/2016  . Severe obesity (BMI >= 40) ([place]) 07/10/2015  . Mixed hyperlipidemia due to type 2 diabetes mellitus ([place]) 07/10/2015  . Essential hypertension 07/09/2015  . Healthcare maintenance 07/09/2015  . Tobacco abuse 09/21/2010  . Diabetes mellitus, type II ([place]) 09/21/2010    Past Surgical History:  Procedure Laterality Date  . APPENDECTOMY  2008  . CESAREAN SECTION  2005   live birth  . CESAREAN SECTION  2011   live birth    OB History    No data available       Home Medications    Prior to Admission medications   Medication Sig Start Date End Date Taking? Authorizing Provider  Diphenhydramine-Phenylephrine 6.25-2.5 MG/5ML LIQD Take 30 mLs by mouth at bedtime as needed (cold symptoms).    Yes [provider]  DM-Phenylephrine-Acetaminophen (ROBITUSSIN COLD+FLU DAYTIME PO) Take 30 mLs by mouth daily as needed (cold symptoms).   Yes [provider]  liraglutide (VICTOZA) 18 MG/3ML SOPN Inject 0.1 mLs (0.6 mg total) into the skin daily. 10/19/16  Yes Aldine Contes, MD  losartan-hydrochlorothiazide (HYZAAR) 50-12.5 MG tablet Take 1 tablet by mouth daily. 10/19/16  Yes Aldine Contes, MD  metFORMIN (GLUCOPHAGE) 500 MG tablet Take 1 tablet (500 mg total) by mouth 2 (two) times daily with a meal. 10/19/16  Yes Aldine Contes, MD  naproxen sodium (ALEVE) 220 MG tablet Take 440 mg by mouth daily as needed (pain).   Yes [provider]  rosuvastatin (CRESTOR) 20 MG tablet Take 1 tablet (20 mg total) by mouth daily. 10/23/16  Yes Aldine Contes, MD  BAYER MICROLET LANCETS lancets Use as instructed two times daily 08/21/16   Axel Filler, MD  benzonatate (TESSALON PERLES) 100 MG capsule Take 1 capsule (100 mg total) by mouth 3 (three) times daily as needed for cough. Patient not taking: Reported on 01/22/2017 10/19/16 10/19/17  Aldine Contes, MD  Blood Glucose Monitoring Suppl (CONTOUR NEXT ONE) KIT 1 each by Does not apply route 2 (two) times daily. 08/21/16   Axel Filler, MD  glucose blood (CONTOUR NEXT TEST) test strip Use as instructed two times daily 08/21/16   Axel Filler, MD  Insulin Pen Needle (PEN NEEDLES) 32G X 4 MM MISC 1 each by Does not apply  route daily. 10/19/16   Aldine Contes, MD    Family History Family History  Problem Relation Age of Onset  . Diabetes Mother   . Hypertension Father     Social History Social History   Tobacco Use  . Smoking status: Current Some Day Smoker    Packs/day: 0.50  . Smokeless tobacco: Never Used  . Tobacco comment: trying  Substance Use Topics  . Alcohol use: Yes    Alcohol/week: 0.0 oz  . Drug use: No     Allergies   Patient has no known  allergies.   Review of Systems Review of Systems  All other systems reviewed and are negative.    Physical Exam Updated Vital Signs BP (!) 134/95   Pulse 93   Temp 98.3 F (36.8 C)   Resp 20   Ht 5' 6"  (1.676 m)   Wt 113.4 kg (250 lb)   LMP 01/18/2017   SpO2 99%   BMI 40.35 kg/m   Physical Exam  Constitutional: She is oriented to person, place, and time. She appears well-developed and well-nourished. No distress.  Well-appearing, in no acute distress  HENT:  Head: Atraumatic.  Ears: TMs normal bilaterally Nose: Normal nares Throat: Uvula is midline no tonsillar enlargement or exudates, no trismus  Eyes: Conjunctivae are normal.  Neck: Normal range of motion. Neck supple.  No nuchal rigidity, no cervical lymphadenopathy  Cardiovascular: Normal rate and regular rhythm.  Pulmonary/Chest: Effort normal and breath sounds normal. She has no wheezes.  Abdominal: Soft. She exhibits no distension. There is no tenderness.  Musculoskeletal: Normal range of motion.  Neurological: She is alert and oriented to person, place, and time.  Skin: No rash noted.  Psychiatric: She has a normal mood and affect.  Nursing note and vitals reviewed.    ED Treatments / Results  Labs (all labs ordered are listed, but only abnormal results are displayed) Labs Reviewed  CBG MONITORING, ED - Abnormal; Notable for the following components:      Result Value   Glucose-Capillary 150 (*)    All other components within normal limits    EKG  EKG Interpretation None       Radiology Dg Chest 2 View  Result Date: 01/22/2017 CLINICAL DATA:  Chest tightness, nonproductive cough x4 days EXAM: CHEST  2 VIEW COMPARISON:  08/28/2014 FINDINGS: Lungs are clear.  No pleural effusion or pneumothorax. The heart is normal in size. Visualized osseous structures are within normal limits. IMPRESSION: Normal chest radiographs. Electronically Signed   By: Julian Hy M.D.   On: 01/22/2017 13:14     Procedures Procedures (including critical care time)  Medications Ordered in ED Medications - No data to display   Initial Impression / Assessment and Plan / ED Course  I have reviewed the triage vital signs and the nursing notes.  Pertinent labs & imaging results that were available during my care of the patient were reviewed by me and considered in my medical decision making (see chart for details).     BP (!) 134/95   Pulse 93   Temp 98.3 F (36.8 C)   Resp 20   Ht 5' 6"  (1.676 m)   Wt 113.4 kg (250 lb)   LMP 01/13/2017   SpO2 99%   BMI 40.35 kg/m    Final Clinical Impressions(s) / ED Diagnoses   Final diagnoses:  Flu-like symptoms    ED Discharge Orders        Ordered    benzonatate (TESSALON)  100 MG capsule  Every 8 hours     01/22/17 1336     1:35 PM Patient here with flulike symptoms.  Symptom has been ongoing for the past 5 days.  She is outside the window for Tamiflu.  Chest x-ray shows no signs of pneumonia.  She is well-appearing.  Recommend symptomatic treatment including rest, hydration, Tylenol or ibuprofen as needed.  Will prescribe cough medication.  Return precautions discussed.   Domenic Moras, PA-C 01/22/17 1337    Orlie Dakin, MD 01/22/17 [phone]

## 2017-01-22 NOTE — ED Notes (Signed)
ERROR in Charting IV

## 2017-02-05 ENCOUNTER — Other Ambulatory Visit: Payer: Self-pay | Admitting: Internal Medicine

## 2017-02-05 DIAGNOSIS — I10 Essential (primary) hypertension: Secondary | ICD-10-CM

## 2017-02-05 DIAGNOSIS — E782 Mixed hyperlipidemia: Principal | ICD-10-CM

## 2017-02-05 DIAGNOSIS — E1169 Type 2 diabetes mellitus with other specified complication: Secondary | ICD-10-CM

## 2017-03-06 ENCOUNTER — Ambulatory Visit: Payer: BLUE CROSS/BLUE SHIELD | Admitting: Internal Medicine

## 2017-03-06 ENCOUNTER — Encounter: Payer: Self-pay | Admitting: Internal Medicine

## 2017-03-06 ENCOUNTER — Encounter (INDEPENDENT_AMBULATORY_CARE_PROVIDER_SITE_OTHER): Payer: Self-pay

## 2017-03-06 ENCOUNTER — Other Ambulatory Visit: Payer: Self-pay

## 2017-03-06 VITALS — BP 120/71 | Temp 97.8°F | Ht 62.0 in | Wt 246.1 lb

## 2017-03-06 DIAGNOSIS — I10 Essential (primary) hypertension: Secondary | ICD-10-CM

## 2017-03-06 DIAGNOSIS — Z23 Encounter for immunization: Secondary | ICD-10-CM | POA: Diagnosis not present

## 2017-03-06 DIAGNOSIS — Z79899 Other long term (current) drug therapy: Secondary | ICD-10-CM

## 2017-03-06 DIAGNOSIS — Z6841 Body Mass Index (BMI) 40.0 and over, adult: Secondary | ICD-10-CM

## 2017-03-06 DIAGNOSIS — E782 Mixed hyperlipidemia: Secondary | ICD-10-CM

## 2017-03-06 DIAGNOSIS — Z720 Tobacco use: Secondary | ICD-10-CM

## 2017-03-06 DIAGNOSIS — E1142 Type 2 diabetes mellitus with diabetic polyneuropathy: Secondary | ICD-10-CM | POA: Diagnosis not present

## 2017-03-06 DIAGNOSIS — E118 Type 2 diabetes mellitus with unspecified complications: Secondary | ICD-10-CM

## 2017-03-06 DIAGNOSIS — E1169 Type 2 diabetes mellitus with other specified complication: Secondary | ICD-10-CM | POA: Diagnosis not present

## 2017-03-06 DIAGNOSIS — Z794 Long term (current) use of insulin: Secondary | ICD-10-CM

## 2017-03-06 DIAGNOSIS — F17210 Nicotine dependence, cigarettes, uncomplicated: Secondary | ICD-10-CM | POA: Diagnosis not present

## 2017-03-06 DIAGNOSIS — E1140 Type 2 diabetes mellitus with diabetic neuropathy, unspecified: Secondary | ICD-10-CM | POA: Insufficient documentation

## 2017-03-06 DIAGNOSIS — Z0000 Encounter for general adult medical examination without abnormal findings: Secondary | ICD-10-CM

## 2017-03-06 LAB — GLUCOSE, CAPILLARY: Glucose-Capillary: 118 mg/dL — ABNORMAL HIGH (ref 65–99)

## 2017-03-06 LAB — POCT GLYCOSYLATED HEMOGLOBIN (HGB A1C): Hemoglobin A1C: 8.2

## 2017-03-06 MED ORDER — GABAPENTIN 100 MG PO CAPS: 100.0000 mg | ORAL_CAPSULE | Freq: Every day | ORAL | 0 refills | Status: DC

## 2017-03-06 MED ORDER — METFORMIN HCL 500 MG PO TABS: 1000.0000 mg | ORAL_TABLET | Freq: Two times a day (BID) | ORAL | 1 refills | Status: DC

## 2017-03-06 NOTE — Assessment & Plan Note (Signed)
-  Patient has a history of chronic peripheral neuropathy likely secondary to her diabetes -She states that she had been on Lyrica in the past with good effect but this was discontinued years ago -She states that she gets tingling and numbness in her hands and her feet almost every day and that the symptoms take 15-20 minutes to resolve -We will start her on gabapentin 100 mg at night and titrate this up as necessary -We will check a vitamin B12 and TSH on her next visit

## 2017-03-06 NOTE — Assessment & Plan Note (Signed)
-  Flu shot given today -We will check HIV today -We will defer her Pap smear for now.  Patient states that she is to follow with a gynecologist and that he had noticed a cyst on her last visit.  Patient is unsure of where and we do not have records of this.  Patient states that her gynecologist is now retired. -We will refer her to a new gynecologist to follow-up on this  "cyst" and to get a Pap smear at the same time.  If the cyst does not need further follow-up will consider getting all her Pap smears done at [facility]

## 2017-03-06 NOTE — Assessment & Plan Note (Signed)
-  Patient continues to smoke approximately 1 pack every 2-3 days -She states that she does not want to try any medications at this time and that she will try to quit on her own -We will discuss this again in detail on her next visit

## 2017-03-06 NOTE — Assessment & Plan Note (Signed)
-  This problem is chronic and stable -Patient was started on rosuvastatin 20 mg based on her lipid profile and risk factors done on her last visit -We will check a repeat lipid panel and liver function tests today

## 2017-03-06 NOTE — Assessment & Plan Note (Signed)
-  Patient has been trying to lose weight and is down approximately 14 pounds over the last 8 months -Patient was congratulated on her weight loss and was encouraged to continue to follow her diet and exercise regimen -We will consider increasing her Victoza on her next visit if her A1c remains elevated at this will also help with her weight loss

## 2017-03-06 NOTE — Assessment & Plan Note (Signed)
BP Readings from Last 3 Encounters:  03/06/17 120/71  01/22/17 (!) 134/95  10/19/16 123/71    Lab Results  Component Value Date   NA 139 10/19/2016   K 4.1 10/19/2016   CREATININE 0.65 10/19/2016    Assessment: Blood pressure control:  Well-controlled Progress toward BP goal:   At goal Comments: Patient is compliant with losartan/HCTZ 50/12.5 mg  Plan: Medications:  continue current medications Educational resources provided: brochure(denies need ) Self management tools provided:   Other plans: We will check BMP today

## 2017-03-06 NOTE — Patient Instructions (Addendum)
-  It was a pleasure seeing you today -Please try and cut down on your smoking -We will check some blood work on you today -We will refer you to a gynecologist to follow-up on the cyst as well as to do your Pap smear -Blood pressure is well controlled.  Keep up the good work -Please continue to watch your diet and continue with the exercise.  You are doing great! -Your A1c is still 8.2.  We will increase your Metformin to 1000 mg twice daily -I will also start you on a low-dose of gabapentin for your neuropathy

## 2017-03-06 NOTE — Progress Notes (Signed)
   Subjective:    Patient ID: Traci Butler, female    DOB: 1975/05/10, 42 y.o.   MRN: [id number]  HPI  I have seen and examined this patient.  Patient is here for routine follow-up of her diabetes and hypertension.  Patient states that she continues to smoke but that she is trying to quit.  She has been working on her diet and exercise and has lost approximately 14 pounds over the last 8 months.  She states that she is compliant with all her medications and has no new complaints at this time.     Review of Systems  Constitutional: Negative.   HENT: Negative.   Respiratory: Negative.   Cardiovascular: Negative.   Gastrointestinal: Negative.   Musculoskeletal: Negative.   Neurological: Negative.   Psychiatric/Behavioral: Negative.        Objective:   Physical Exam  Constitutional: She is oriented to person, place, and time. She appears well-developed and well-nourished.  HENT:  Head: Atraumatic.  Mouth/Throat: No oropharyngeal exudate.  Neck: Neck supple.  Cardiovascular: Normal rate, regular rhythm and normal heart sounds.  Pulmonary/Chest: Effort normal and breath sounds normal. No respiratory distress. She has no wheezes.  Abdominal: Soft. Bowel sounds are normal. She exhibits no distension. There is no tenderness.  Musculoskeletal: Normal range of motion. She exhibits no edema.  Lymphadenopathy:    She has no cervical adenopathy.  Neurological: She is alert and oriented to person, place, and time.  Psychiatric: She has a normal mood and affect. Her behavior is normal.          Assessment & Plan:  Please see problem based charting for assessment and plan:

## 2017-03-06 NOTE — Assessment & Plan Note (Signed)
Lab Results  Component Value Date   HGBA1C 8.2 03/06/2017   HGBA1C 8.3 10/19/2016   HGBA1C 9.2 08/16/2016     Assessment: Diabetes control:  Fair Progress toward A1C goal:   Stable Comments: Patient is compliant with metformin 500 mg twice daily and Victoza 0.6 mg subcu daily  Plan: Medications:  We will continue with current dose of her Victoza and increase her metformin to 1000 mg twice daily Home glucose monitoring: Frequency:   Timing:   Instruction/counseling given: reminded to get eye exam and discussed the need for weight loss Educational resources provided: brochure(denies need ) Self management tools provided:   Other plans: Patient to follow-up with her ophthalmologist this month.  We will consider increasing her Victoza at her next visit if her A1c remains elevated as it will help both with the diabetes as well as weight loss

## 2017-03-07 ENCOUNTER — Telehealth: Payer: Self-pay | Admitting: Internal Medicine

## 2017-03-07 LAB — CMP14 + ANION GAP
ALBUMIN: 4.2 g/dL (ref 3.5–5.5)
ALT: 11 IU/L (ref 0–32)
AST: 14 IU/L (ref 0–40)
Albumin/Globulin Ratio: 1.5 (ref 1.2–2.2)
Alkaline Phosphatase: 58 IU/L (ref 39–117)
Anion Gap: 15 mmol/L (ref 10.0–18.0)
BUN / CREAT RATIO: 15 (ref 9–23)
BUN: 13 mg/dL (ref 6–24)
Bilirubin Total: 0.4 mg/dL (ref 0.0–1.2)
CALCIUM: 9.3 mg/dL (ref 8.7–10.2)
CO2: 21 mmol/L (ref 20–29)
Chloride: 100 mmol/L (ref 96–106)
Creatinine, Ser: 0.86 mg/dL (ref 0.57–1.00)
GFR, EST AFRICAN AMERICAN: 98 mL/min/{1.73_m2} (ref >59)
GFR, EST NON AFRICAN AMERICAN: 85 mL/min/{1.73_m2} (ref >59)
GLOBULIN, TOTAL: 2.8 g/dL (ref 1.5–4.5)
GLUCOSE: 135 mg/dL — AB (ref 65–99)
Potassium: 4.2 mmol/L (ref 3.5–5.2)
Sodium: 136 mmol/L (ref 134–144)
TOTAL PROTEIN: 7 g/dL (ref 6.0–8.5)

## 2017-03-07 LAB — HIV ANTIBODY (ROUTINE TESTING W REFLEX): HIV SCREEN 4TH GENERATION: NONREACTIVE

## 2017-03-07 LAB — LIPID PANEL
CHOL/HDL RATIO: 2.7 ratio (ref 0.0–4.4)
Cholesterol, Total: 92 mg/dL — ABNORMAL LOW (ref 100–199)
HDL: 34 mg/dL — AB (ref >39)
LDL Calculated: 33 mg/dL (ref 0–99)
Triglycerides: 123 mg/dL (ref 0–149)
VLDL Cholesterol Cal: 25 mg/dL (ref 5–40)

## 2017-03-07 NOTE — Telephone Encounter (Signed)
I called patient to discuss results of her blood work.  Patient CMP was within normal limits with only a mildly elevated blood glucose.  Her cholesterol panel showed an appropriate decrease in her LDL on high intensity statin.  Patient was noted to have a mildly low HDL but this is also improved from her last visit.  Patient was advised to follow a good diet.  HIV screen was negative.  Patient expresses understanding and is in agreement with plan.

## 2017-03-19 LAB — HM DIABETES EYE EXAM

## 2017-04-02 ENCOUNTER — Encounter: Payer: Self-pay | Admitting: Internal Medicine

## 2017-04-17 ENCOUNTER — Ambulatory Visit: Payer: BLUE CROSS/BLUE SHIELD | Admitting: Obstetrics & Gynecology

## 2017-04-30 ENCOUNTER — Encounter: Payer: Self-pay | Admitting: Obstetrics and Gynecology

## 2017-04-30 ENCOUNTER — Ambulatory Visit (INDEPENDENT_AMBULATORY_CARE_PROVIDER_SITE_OTHER): Payer: BLUE CROSS/BLUE SHIELD | Admitting: Obstetrics and Gynecology

## 2017-04-30 VITALS — BP 128/86 | Ht 66.0 in | Wt 246.0 lb

## 2017-04-30 DIAGNOSIS — Z01419 Encounter for gynecological examination (general) (routine) without abnormal findings: Secondary | ICD-10-CM | POA: Diagnosis not present

## 2017-04-30 DIAGNOSIS — Z124 Encounter for screening for malignant neoplasm of cervix: Secondary | ICD-10-CM | POA: Diagnosis not present

## 2017-04-30 DIAGNOSIS — Z113 Encounter for screening for infections with a predominantly sexual mode of transmission: Secondary | ICD-10-CM

## 2017-04-30 DIAGNOSIS — Z1151 Encounter for screening for human papillomavirus (HPV): Secondary | ICD-10-CM | POA: Diagnosis not present

## 2017-04-30 NOTE — Progress Notes (Signed)
Subjective:     Traci Butler is a 42 y.o. female P2 with BMI 39 who is here for a comprehensive physical exam. The patient reports no problems. She is sexually active without contraception. She denies any pelvic pain or abnormal discharge. She reports a monthly period lasting 4-5 days. She reports some urge incontinence. She admits to consuming a lot of caffeine containing soft drinks and water. She is aware that she needs to decrease her consumption due to her diabetes. Patient reports being diagnosed with an ovarian cyst a few years ago and is requesting follow up  Past Medical History:  Diagnosis Date  . Diabetes mellitus without complication ([place])   . High cholesterol   . Hypertension    Past Surgical History:  Procedure Laterality Date  . APPENDECTOMY  2008  . CESAREAN SECTION  2005   live birth  . CESAREAN SECTION  2011   live birth   Family History  Problem Relation Age of Onset  . Diabetes Mother   . Hypertension Father    Social History   Socioeconomic History  . Marital status: Single    Spouse name: Not on file  . Number of children: Not on file  . Years of education: Not on file  . Highest education level: Not on file  Occupational History  . Not on file  Social Needs  . Financial resource strain: Not on file  . Food insecurity:    Worry: Not on file    Inability: Not on file  . Transportation needs:    Medical: Not on file    Non-medical: Not on file  Tobacco Use  . Smoking status: Current Some Day Smoker    Packs/day: 0.50  . Smokeless tobacco: Never Used  . Tobacco comment: trying  Substance and Sexual Activity  . Alcohol use: Yes    Alcohol/week: 0.0 oz  . Drug use: No  . Sexual activity: Not on file  Lifestyle  . Physical activity:    Days per week: Not on file    Minutes per session: Not on file  . Stress: Not on file  Relationships  . Social connections:    Talks on phone: Not on file    Gets together: Not on file    Attends religious  service: Not on file    Active member of club or organization: Not on file    Attends meetings of clubs or organizations: Not on file    Relationship status: Not on file  . Intimate partner violence:    Fear of current or ex partner: Not on file    Emotionally abused: Not on file    Physically abused: Not on file    Forced sexual activity: Not on file  Other Topics Concern  . Not on file  Social History Narrative  . Not on file   Health Maintenance  Topic Date Due  . OPHTHALMOLOGY EXAM  05/03/1986  . PAP SMEAR  05/02/1997  . PNEUMOCOCCAL POLYSACCHARIDE VACCINE (2) 09/21/2015  . HEMOGLOBIN A1C  06/03/2017  . INFLUENZA VACCINE  08/23/2017  . FOOT EXAM  10/19/2017  . LIPID PANEL  03/06/2018  . TETANUS/TDAP  09/20/2020  . HIV Screening  Completed     Review of Systems Pertinent items are noted in HPI.   Objective:  Blood pressure 128/86, height 5\' 6"  (1.676 m), weight 246 lb (111.6 kg), last menstrual period 04/02/2017.     GENERAL: Well-developed, well-nourished female in no acute distress.  HEENT: Normocephalic,  atraumatic. Sclerae anicteric.  NECK: Supple. Normal thyroid.  LUNGS: Clear to auscultation bilaterally.  HEART: Regular rate and rhythm. BREASTS: Symmetric in size. No palpable masses or lymphadenopathy, skin changes, or nipple drainage. ABDOMEN: Soft, nontender, nondistended. No organomegaly. PELVIC: Normal external female genitalia. Vagina is pink and rugated.  Normal discharge. Normal appearing cervix. Uterus is normal in size. No adnexal mass or tenderness. EXTREMITIES: No cyanosis, clubbing, or edema, 2+ distal pulses.    Assessment:    Healthy female exam.      Plan:    pap smear collected Screening mammogram ordered Pelvic ultrasound ordered Patient had labs with PCP in February Follow up with PCP for the continued management of her DM and HTN Patient will be contacted with abnormal results See After Visit Summary for Counseling Recommendations

## 2017-05-02 LAB — CYTOLOGY - PAP
Chlamydia: NEGATIVE
DIAGNOSIS: NEGATIVE
HPV (WINDOPATH): NOT DETECTED
Neisseria Gonorrhea: NEGATIVE

## 2017-05-07 ENCOUNTER — Other Ambulatory Visit: Payer: Self-pay | Admitting: Internal Medicine

## 2017-05-07 DIAGNOSIS — E118 Type 2 diabetes mellitus with unspecified complications: Secondary | ICD-10-CM

## 2017-05-08 ENCOUNTER — Other Ambulatory Visit: Payer: Self-pay | Admitting: Internal Medicine

## 2017-05-08 ENCOUNTER — Ambulatory Visit (HOSPITAL_COMMUNITY): Admission: RE | Admit: 2017-05-08 | Payer: BLUE CROSS/BLUE SHIELD | Source: Ambulatory Visit

## 2017-05-08 DIAGNOSIS — E1169 Type 2 diabetes mellitus with other specified complication: Secondary | ICD-10-CM

## 2017-05-08 DIAGNOSIS — E782 Mixed hyperlipidemia: Principal | ICD-10-CM

## 2017-05-08 DIAGNOSIS — I10 Essential (primary) hypertension: Secondary | ICD-10-CM

## 2017-05-30 ENCOUNTER — Other Ambulatory Visit: Payer: Self-pay | Admitting: Internal Medicine

## 2017-06-01 ENCOUNTER — Other Ambulatory Visit: Payer: Self-pay | Admitting: Internal Medicine

## 2017-06-01 DIAGNOSIS — E1142 Type 2 diabetes mellitus with diabetic polyneuropathy: Secondary | ICD-10-CM

## 2017-06-05 ENCOUNTER — Encounter: Payer: Self-pay | Admitting: Internal Medicine

## 2017-06-05 ENCOUNTER — Ambulatory Visit: Payer: BLUE CROSS/BLUE SHIELD | Admitting: Internal Medicine

## 2017-06-05 ENCOUNTER — Other Ambulatory Visit: Payer: Self-pay

## 2017-06-05 ENCOUNTER — Encounter: Payer: BLUE CROSS/BLUE SHIELD | Admitting: Dietician

## 2017-06-05 VITALS — BP 135/82 | HR 78 | Temp 98.7°F | Ht 66.0 in | Wt 246.0 lb

## 2017-06-05 DIAGNOSIS — Z6839 Body mass index (BMI) 39.0-39.9, adult: Secondary | ICD-10-CM

## 2017-06-05 DIAGNOSIS — E782 Mixed hyperlipidemia: Secondary | ICD-10-CM

## 2017-06-05 DIAGNOSIS — E118 Type 2 diabetes mellitus with unspecified complications: Secondary | ICD-10-CM

## 2017-06-05 DIAGNOSIS — R1013 Epigastric pain: Secondary | ICD-10-CM

## 2017-06-05 DIAGNOSIS — N83209 Unspecified ovarian cyst, unspecified side: Secondary | ICD-10-CM | POA: Insufficient documentation

## 2017-06-05 DIAGNOSIS — E1140 Type 2 diabetes mellitus with diabetic neuropathy, unspecified: Secondary | ICD-10-CM | POA: Diagnosis not present

## 2017-06-05 DIAGNOSIS — Z7984 Long term (current) use of oral hypoglycemic drugs: Secondary | ICD-10-CM

## 2017-06-05 DIAGNOSIS — I10 Essential (primary) hypertension: Secondary | ICD-10-CM

## 2017-06-05 DIAGNOSIS — F17210 Nicotine dependence, cigarettes, uncomplicated: Secondary | ICD-10-CM | POA: Diagnosis not present

## 2017-06-05 DIAGNOSIS — Z8742 Personal history of other diseases of the female genital tract: Secondary | ICD-10-CM

## 2017-06-05 DIAGNOSIS — R109 Unspecified abdominal pain: Secondary | ICD-10-CM | POA: Insufficient documentation

## 2017-06-05 DIAGNOSIS — Z8719 Personal history of other diseases of the digestive system: Secondary | ICD-10-CM

## 2017-06-05 DIAGNOSIS — Z79899 Other long term (current) drug therapy: Secondary | ICD-10-CM

## 2017-06-05 DIAGNOSIS — E1169 Type 2 diabetes mellitus with other specified complication: Secondary | ICD-10-CM

## 2017-06-05 DIAGNOSIS — E1142 Type 2 diabetes mellitus with diabetic polyneuropathy: Secondary | ICD-10-CM

## 2017-06-05 DIAGNOSIS — Z720 Tobacco use: Secondary | ICD-10-CM

## 2017-06-05 LAB — GLUCOSE, CAPILLARY: GLUCOSE-CAPILLARY: 144 mg/dL — AB (ref 65–99)

## 2017-06-05 LAB — POCT GLYCOSYLATED HEMOGLOBIN (HGB A1C): HEMOGLOBIN A1C: 7.3

## 2017-06-05 MED ORDER — LIRAGLUTIDE 18 MG/3ML ~~LOC~~ SOPN: 1.2000 mg | PEN_INJECTOR | Freq: Every day | SUBCUTANEOUS | 1 refills | Status: DC

## 2017-06-05 MED ORDER — GABAPENTIN 300 MG PO CAPS: 300.0000 mg | ORAL_CAPSULE | Freq: Every day | ORAL | 3 refills | Status: DC

## 2017-06-05 NOTE — Patient Instructions (Signed)
-  It was a pleasure seeing you today -We have increased your Victoza to 1.2 mg daily -Your A1c is now 7.3.  We will recheck it again in 3 months.  Keep up the great work! -We will increase her gabapentin to 300 mg at night.  I have called in a prescription to your pharmacy. -We will check blood work on your next visit -Please call me with any questions

## 2017-06-05 NOTE — Assessment & Plan Note (Signed)
Lab Results  Component Value Date   HGBA1C 7.3 06/05/2017   HGBA1C 8.2 03/06/2017   HGBA1C 8.3 10/19/2016     Assessment: Diabetes control:  Improved Progress toward A1C goal:   Near goal  comments: Patient is compliant with Victoza 0.6 mg daily and metformin 1000 mg twice daily  Plan: Medications:  We will increase Victoza to 1.2 mg daily and continue with metformin 1000 mg twice daily Home glucose monitoring: Frequency:   Timing:   Instruction/counseling given: discussed the need for weight loss Educational resources provided:   Self management tools provided:   Other plans: We will check BMP on follow-up visit

## 2017-06-05 NOTE — Assessment & Plan Note (Signed)
BP Readings from Last 3 Encounters:  06/05/17 135/82  04/30/17 128/86  03/06/17 120/71    Lab Results  Component Value Date   NA 136 03/06/2017   K 4.2 03/06/2017   CREATININE 0.86 03/06/2017    Assessment: Blood pressure control:  Well-controlled Progress toward BP goal:   At goal Comments: Patient is compliant with losartan/hydrochlorthiazide 50/12.5 mg  Plan: Medications:  continue current medications Educational resources provided:   Self management tools provided:   Other plans: We will check BMP on next visit

## 2017-06-05 NOTE — Assessment & Plan Note (Signed)
-  Patient states that last week she had intermittent episodes of epigastric pain which was nonradiating and sharp in nature -She had 2 episodes of nausea and vomiting associated with the pain -Patient states that she has no pain currently and that she has not had an episode of pain in week -I suspect the patient may have had a gastroenteritis causing her to have abdominal discomfort as well as nausea and vomiting and this appears to resolved currently -No further work-up at this time

## 2017-06-05 NOTE — Progress Notes (Signed)
   Subjective:    Patient ID: Traci Butler, female    DOB: 1975/06/04, 42 y.o.   MRN: [id number]  HPI  I have seen and examined this patient.  Patient is here for routine follow-up of her hypertension and diabetes.  Patient states that she feels well today.  However, last week she noted intermittent epigastric pain with 2 episodes of nausea and vomiting.  She states that she has not had any episodes since last week and feels well today.  She has no other complaints at this time and is compliant with all her medications.   Review of Systems  Constitutional: Negative.   HENT: Negative.   Respiratory: Negative.   Cardiovascular: Negative.   Gastrointestinal: Negative.   Musculoskeletal: Negative.   Neurological: Negative.   Psychiatric/Behavioral: Negative.        Objective:   Physical Exam  Constitutional: She is oriented to person, place, and time. She appears well-developed and well-nourished.  HENT:  Head: Normocephalic and atraumatic.  Neck: Neck supple.  Cardiovascular: Normal rate and regular rhythm. Exam reveals no friction rub.  No murmur heard. Pulmonary/Chest: Effort normal and breath sounds normal. No respiratory distress. She has no wheezes. She has no rales.  Abdominal: Soft. Bowel sounds are normal. She exhibits no distension. There is no tenderness.  Musculoskeletal: Normal range of motion. She exhibits no edema.  Lymphadenopathy:    She has no cervical adenopathy.  Neurological: She is alert and oriented to person, place, and time.  Psychiatric: She has a normal mood and affect. Her behavior is normal.          Assessment & Plan:  Please see problem based charting for assessment and plan:

## 2017-06-05 NOTE — Addendum Note (Signed)
Addended by: Aldine Contes on: 06/05/2017 11:10 AM   Modules accepted: Orders

## 2017-06-05 NOTE — Assessment & Plan Note (Signed)
-  Patient still smokes half a pack a day -Explained to patient importance of quitting -She expressed understanding and states that she is trying to cut down -We will discuss this in detail on her follow-up visit

## 2017-06-05 NOTE — Assessment & Plan Note (Signed)
-  Patient's weight has been stable since her last visit -She has been trying to diet and exercise -I have increased her Victoza to 1.2 mg daily to help with her diabetes as well as her weight loss -Encourage patient to continue to try to lose weight -We will follow-up at next visit

## 2017-06-05 NOTE — Assessment & Plan Note (Signed)
-  This problem is chronic and stable -patient is compliant with rosuvastatin 20 mg -We will check a lipid panel on her next visit -No further work-up at this time

## 2017-06-05 NOTE — Assessment & Plan Note (Signed)
-  This problem is chronic and stable -Patient states that the gabapentin has been helping but she still has some intermittent tingling and numbness in her hands at night -We will increase her gabapentin to 300 mg at night -No further work-up at this time -We will follow-up at next visit

## 2017-06-05 NOTE — Assessment & Plan Note (Signed)
-  Patient has a history of an ovarian cyst -She was recently seen by gynecology and had a normal pelvic exam done at that time -they had ordered a pelvic ultrasound but was unable to be done  secondary to cost to the patient -I will reorder this and discuss with our front office if we can get this test done for her given her history of an ovarian cyst and need for follow-up

## 2017-06-05 NOTE — Addendum Note (Signed)
Addended by: Hulan Fray on: 06/05/2017 04:25 PM   Modules accepted: Orders

## 2017-06-07 ENCOUNTER — Encounter: Payer: Self-pay | Admitting: *Deleted

## 2017-06-13 ENCOUNTER — Other Ambulatory Visit: Payer: Self-pay | Admitting: Internal Medicine

## 2017-06-13 DIAGNOSIS — N83209 Unspecified ovarian cyst, unspecified side: Secondary | ICD-10-CM

## 2017-06-28 ENCOUNTER — Ambulatory Visit (HOSPITAL_COMMUNITY)
Admission: RE | Admit: 2017-06-28 | Discharge: 2017-06-28 | Disposition: A | Discharge status: Home / Self Care | Payer: BLUE CROSS/BLUE SHIELD | Source: Ambulatory Visit | Attending: Internal Medicine | Admitting: Internal Medicine

## 2017-06-28 ENCOUNTER — Telehealth: Payer: Self-pay | Admitting: *Deleted

## 2017-06-28 ENCOUNTER — Telehealth: Payer: Self-pay | Admitting: Internal Medicine

## 2017-06-28 ENCOUNTER — Ambulatory Visit (HOSPITAL_COMMUNITY): Payer: BLUE CROSS/BLUE SHIELD

## 2017-06-28 DIAGNOSIS — N83201 Unspecified ovarian cyst, right side: Secondary | ICD-10-CM | POA: Diagnosis not present

## 2017-06-28 DIAGNOSIS — N83209 Unspecified ovarian cyst, unspecified side: Secondary | ICD-10-CM | POA: Diagnosis present

## 2017-06-28 NOTE — Telephone Encounter (Signed)
[facility] radiology calls to alert md to today's transvaginal US, please note the impression

## 2017-06-28 NOTE — Telephone Encounter (Signed)
Thank you. I spoke with Traci Butler about the ultrasound results. I asked her to call Dr. Domenic Schwab office to make an appointment when available to talk about further testing or excision options. Patient understands.

## 2017-06-28 NOTE — Telephone Encounter (Signed)
I attempted to call, left VM for her to call our office back so I can discuss results with her.

## 2017-06-28 NOTE — Telephone Encounter (Signed)
Please have physician to call patient back, Dr Evette Doffing.

## 2017-06-28 NOTE — Telephone Encounter (Signed)
Yes, she had 2 more questions, stated she was thinking a lot when the 2 of you talked  1) wanted to know the general size of the cyst, informed her the average sixe of the cyst would be about the size of a small egg  2) she called [facility] and the first opening they have is 7/1 at 0915, do you want her seen before this? If so, triage will call and see if it can be arranged at an earlier date

## 2017-06-28 NOTE — Telephone Encounter (Signed)
Patient is returning your call.  

## 2017-06-28 NOTE — Telephone Encounter (Signed)
I spoke with patient at 1:30. Is this a new question? Bonnita Nasuti, can you field for me?

## 2017-07-09 ENCOUNTER — Other Ambulatory Visit: Payer: Self-pay | Admitting: Internal Medicine

## 2017-07-09 DIAGNOSIS — E118 Type 2 diabetes mellitus with unspecified complications: Secondary | ICD-10-CM

## 2017-07-23 ENCOUNTER — Encounter: Payer: Self-pay | Admitting: Obstetrics and Gynecology

## 2017-07-23 ENCOUNTER — Ambulatory Visit (INDEPENDENT_AMBULATORY_CARE_PROVIDER_SITE_OTHER): Payer: BLUE CROSS/BLUE SHIELD | Admitting: Obstetrics and Gynecology

## 2017-07-23 VITALS — BP 153/92 | HR 70 | Wt 241.0 lb

## 2017-07-23 DIAGNOSIS — N83201 Unspecified ovarian cyst, right side: Secondary | ICD-10-CM | POA: Diagnosis not present

## 2017-07-23 DIAGNOSIS — Z712 Person consulting for explanation of examination or test findings: Secondary | ICD-10-CM

## 2017-07-23 NOTE — Progress Notes (Signed)
42 yo here to discuss results of recent ultrasound. Patient reports feeling well and is without complaints  Past Medical History:  Diagnosis Date  . Diabetes mellitus without complication ([place])   . High cholesterol   . Hypertension    Past Surgical History:  Procedure Laterality Date  . APPENDECTOMY  2008  . CESAREAN SECTION  2005   live birth  . CESAREAN SECTION  2011   live birth   Family History  Problem Relation Age of Onset  . Diabetes Mother   . Hypertension Father    Social History   Tobacco Use  . Smoking status: Current Some Day Smoker    Packs/day: 0.50  . Smokeless tobacco: Never Used  . Tobacco comment: trying  Substance Use Topics  . Alcohol use: Yes    Alcohol/week: 0.0 oz  . Drug use: No   ROS See pertinent in HPI  GENERAL: Well-developed, well-nourished female in no acute distress.  ABDOMEN: Soft, nontender, nondistended. No organomegaly. NEURO: alert and oriented x 3  FINDINGS: Uterus  Measurements: 10.5 x 5.8 x 5.8 cm. The myometrium is heterogeneous in appearance with focal areas of posterior acoustic shadowing.  Endometrium  Thickness: 9 mm.  No focal abnormality visualized.  Right ovary  Measurements: 5.5 x 4.2 x 6.0 cm. Within the right ovary there is a 6.0 x 3.6 x 4.8 cm cystic mass with thick internal septations, nodularity and associated vascularity.  Left ovary  Measurements: 4.2 x 2.0 x 2.9 cm. Normal appearance/no adnexal mass.  Other findings  No abnormal free fluid.  IMPRESSION: Complex cystic mass with septations and small peripheral nodularity in the right ovary. Given the overall appearance, surgical consultation is warranted.  These results will be called to the ordering clinician or representative by the Radiologist Assistant, and communication documented in the PACS or zVision Dashboard.   Electronically Signed   By: Lovey Newcomer M.D.   On: 06/28/2017 10:33  A/P 42 yo with right ovarian  cyst - Ultrasound findings reviewed with the patient - Discussed surgical management of complex right ovarian cyst via laparoscopic salpingoophorectomy. Risks, benefits and alternatives were explained including but not limited to risks of bleeding, infection and damage to adjacent organs. Patient verbalized understanding - CA-125 ordered today

## 2017-07-24 ENCOUNTER — Encounter (HOSPITAL_COMMUNITY): Payer: Self-pay

## 2017-07-24 LAB — CA 125: Cancer Antigen (CA) 125: 12 U/mL (ref 0.0–38.1)

## 2017-07-25 ENCOUNTER — Other Ambulatory Visit: Payer: Self-pay | Admitting: Obstetrics and Gynecology

## 2017-08-10 NOTE — Patient Instructions (Addendum)
Your procedure is scheduled on: Tuesday July 30 at 7:30 am  Enter through the [place] [facility] at: 6:00 am  Pick up the phone at the desk and dial [phone].  Call this number if you have problems the morning of surgery: [phone].  Remember: Do NOT eat food or drink any liquids after: Midnight on Monday July 29   Take these medicines the morning of surgery with a SIP OF WATER: VICTOZA, CRESTOR,   HOLD METFORMIN FOR 24 HOURS PRIOR TO SURGERY  STOP ALL VITAMINS, SUPPLEMENTS, HERBAL MEDICATIONS NOW  DO NOT SMOKE DAY OF SURGERY  BRUSH YOUR TEETH DAY OF SURGERY   Do NOT wear jewelry (body piercing), metal hair clips/bobby pins, make-up, or nail polish. Do NOT wear lotions, powders, or perfumes.  You may wear deoderant. Do NOT shave for 48 hours prior to surgery. Do NOT bring valuables to the hospital. Contacts, dentures, or bridgework may not be worn into surgery.  Have a responsible adult drive you home and stay with you for 24 hours after your procedure

## 2017-08-13 ENCOUNTER — Other Ambulatory Visit: Payer: Self-pay | Admitting: Obstetrics and Gynecology

## 2017-08-15 ENCOUNTER — Encounter (HOSPITAL_COMMUNITY): Payer: Self-pay

## 2017-08-15 ENCOUNTER — Encounter (HOSPITAL_COMMUNITY)
Admission: RE | Admit: 2017-08-15 | Discharge: 2017-08-15 | Disposition: A | Discharge status: Home / Self Care | Payer: BLUE CROSS/BLUE SHIELD | Source: Ambulatory Visit | Attending: Obstetrics and Gynecology | Admitting: Obstetrics and Gynecology

## 2017-08-15 ENCOUNTER — Other Ambulatory Visit: Payer: Self-pay

## 2017-08-15 DIAGNOSIS — I440 Atrioventricular block, first degree: Secondary | ICD-10-CM | POA: Insufficient documentation

## 2017-08-15 DIAGNOSIS — I517 Cardiomegaly: Secondary | ICD-10-CM | POA: Diagnosis not present

## 2017-08-15 DIAGNOSIS — Z01810 Encounter for preprocedural cardiovascular examination: Secondary | ICD-10-CM | POA: Diagnosis present

## 2017-08-15 DIAGNOSIS — Z01812 Encounter for preprocedural laboratory examination: Secondary | ICD-10-CM | POA: Insufficient documentation

## 2017-08-15 LAB — CBC
HCT: 38.8 % (ref 36.0–46.0)
Hemoglobin: 13.2 g/dL (ref 12.0–15.0)
MCH: 28.4 pg (ref 26.0–34.0)
MCHC: 34 g/dL (ref 30.0–36.0)
MCV: 83.4 fL (ref 78.0–100.0)
PLATELETS: 393 10*3/uL (ref 150–400)
RBC: 4.65 MIL/uL (ref 3.87–5.11)
RDW: 13.7 % (ref 11.5–15.5)
WBC: 9.9 10*3/uL (ref 4.0–10.5)

## 2017-08-15 LAB — BASIC METABOLIC PANEL
ANION GAP: 9 (ref 5–15)
BUN: 13 mg/dL (ref 6–20)
CALCIUM: 9.7 mg/dL (ref 8.9–10.3)
CO2: 25 mmol/L (ref 22–32)
CREATININE: 0.9 mg/dL (ref 0.44–1.00)
Chloride: 101 mmol/L (ref 98–111)
GFR calc Af Amer: >60 mL/min (ref >60)
GFR calc non Af Amer: >60 mL/min (ref >60)
GLUCOSE: 99 mg/dL (ref 70–99)
Potassium: 3.9 mmol/L (ref 3.5–5.1)
Sodium: 135 mmol/L (ref 135–145)

## 2017-08-15 LAB — ABO/RH: ABO/RH(D): O POS

## 2017-08-15 NOTE — Pre-Procedure Instructions (Signed)
Dr. Marcie Bal viewed and okay 'd EKG. Aware of history of hypertension, diabetes.

## 2017-08-16 LAB — TYPE AND SCREEN
ABO/RH(D): O POS
Antibody Screen: NEGATIVE

## 2017-08-21 ENCOUNTER — Ambulatory Visit (HOSPITAL_COMMUNITY)
Admission: AD | Admit: 2017-08-21 | Discharge: 2017-08-21 | Disposition: A | Discharge status: Home / Self Care | Payer: BLUE CROSS/BLUE SHIELD | Source: Ambulatory Visit | Attending: Obstetrics and Gynecology | Admitting: Obstetrics and Gynecology

## 2017-08-21 ENCOUNTER — Ambulatory Visit (HOSPITAL_COMMUNITY): Payer: BLUE CROSS/BLUE SHIELD | Admitting: Registered Nurse

## 2017-08-21 ENCOUNTER — Encounter (HOSPITAL_COMMUNITY): Payer: Self-pay | Admitting: Emergency Medicine

## 2017-08-21 ENCOUNTER — Encounter (HOSPITAL_COMMUNITY)
Admission: AD | Disposition: A | Discharge status: Home / Self Care | Payer: Self-pay | Source: Ambulatory Visit | Attending: Obstetrics and Gynecology

## 2017-08-21 DIAGNOSIS — R1909 Other intra-abdominal and pelvic swelling, mass and lump: Secondary | ICD-10-CM | POA: Diagnosis not present

## 2017-08-21 DIAGNOSIS — E669 Obesity, unspecified: Secondary | ICD-10-CM | POA: Insufficient documentation

## 2017-08-21 DIAGNOSIS — E7800 Pure hypercholesterolemia, unspecified: Secondary | ICD-10-CM | POA: Insufficient documentation

## 2017-08-21 DIAGNOSIS — N7011 Chronic salpingitis: Secondary | ICD-10-CM | POA: Insufficient documentation

## 2017-08-21 DIAGNOSIS — Z6837 Body mass index (BMI) 37.0-37.9, adult: Secondary | ICD-10-CM | POA: Diagnosis not present

## 2017-08-21 DIAGNOSIS — Z79899 Other long term (current) drug therapy: Secondary | ICD-10-CM | POA: Insufficient documentation

## 2017-08-21 DIAGNOSIS — I10 Essential (primary) hypertension: Secondary | ICD-10-CM | POA: Diagnosis not present

## 2017-08-21 DIAGNOSIS — F17210 Nicotine dependence, cigarettes, uncomplicated: Secondary | ICD-10-CM | POA: Diagnosis not present

## 2017-08-21 DIAGNOSIS — N83201 Unspecified ovarian cyst, right side: Secondary | ICD-10-CM | POA: Diagnosis present

## 2017-08-21 DIAGNOSIS — Z794 Long term (current) use of insulin: Secondary | ICD-10-CM | POA: Diagnosis not present

## 2017-08-21 DIAGNOSIS — N8311 Corpus luteum cyst of right ovary: Secondary | ICD-10-CM | POA: Insufficient documentation

## 2017-08-21 DIAGNOSIS — E119 Type 2 diabetes mellitus without complications: Secondary | ICD-10-CM | POA: Insufficient documentation

## 2017-08-21 HISTORY — PX: CYSTOSCOPY: SHX5120

## 2017-08-21 HISTORY — PX: LAPAROSCOPIC SALPINGO OOPHERECTOMY: SHX5927

## 2017-08-21 LAB — GLUCOSE, CAPILLARY
Glucose-Capillary: 150 mg/dL — ABNORMAL HIGH (ref 70–99)
Glucose-Capillary: 239 mg/dL — ABNORMAL HIGH (ref 70–99)

## 2017-08-21 LAB — PREGNANCY, URINE: Preg Test, Ur: NEGATIVE

## 2017-08-21 SURGERY — SALPINGO-OOPHORECTOMY, LAPAROSCOPIC
Anesthesia: General | Site: Bladder | Laterality: Right

## 2017-08-21 MED ORDER — KETOROLAC TROMETHAMINE 30 MG/ML IJ SOLN
INTRAMUSCULAR | Status: AC
  Filled 2017-08-21: qty 1

## 2017-08-21 MED ORDER — HYDROMORPHONE HCL 1 MG/ML IJ SOLN
INTRAMUSCULAR | Status: AC
  Administered 2017-08-21: 0.5 mg via INTRAVENOUS
  Filled 2017-08-21: qty 1

## 2017-08-21 MED ORDER — FENTANYL CITRATE (PF) 250 MCG/5ML IJ SOLN
INTRAMUSCULAR | Status: DC | PRN
  Administered 2017-08-21: 150 ug via INTRAVENOUS
  Administered 2017-08-21 (×2): 50 ug via INTRAVENOUS

## 2017-08-21 MED ORDER — BUPIVACAINE HCL (PF) 0.25 % IJ SOLN
INTRAMUSCULAR | Status: AC
  Filled 2017-08-21: qty 30

## 2017-08-21 MED ORDER — LIDOCAINE 2% (20 MG/ML) 5 ML SYRINGE
INTRAMUSCULAR | Status: DC | PRN
  Administered 2017-08-21: 100 mg via INTRAVENOUS

## 2017-08-21 MED ORDER — DOCUSATE SODIUM 100 MG PO CAPS: 100.0000 mg | ORAL_CAPSULE | Freq: Two times a day (BID) | ORAL | 2 refills | Status: DC

## 2017-08-21 MED ORDER — KETOROLAC TROMETHAMINE 30 MG/ML IJ SOLN
30.0000 mg | Freq: Once | INTRAMUSCULAR | Status: AC | PRN
  Administered 2017-08-21: 30 mg via INTRAVENOUS

## 2017-08-21 MED ORDER — IBUPROFEN 600 MG PO TABS: 600.0000 mg | ORAL_TABLET | Freq: Four times a day (QID) | ORAL | 3 refills | Status: DC | PRN

## 2017-08-21 MED ORDER — DEXAMETHASONE SODIUM PHOSPHATE 10 MG/ML IJ SOLN
INTRAMUSCULAR | Status: AC
  Filled 2017-08-21: qty 1

## 2017-08-21 MED ORDER — HYDROMORPHONE HCL 1 MG/ML IJ SOLN
INTRAMUSCULAR | Status: AC
  Filled 2017-08-21: qty 1

## 2017-08-21 MED ORDER — OXYCODONE-ACETAMINOPHEN 5-325 MG PO TABS
ORAL_TABLET | ORAL | Status: AC
  Filled 2017-08-21: qty 1

## 2017-08-21 MED ORDER — FENTANYL CITRATE (PF) 250 MCG/5ML IJ SOLN
INTRAMUSCULAR | Status: AC
  Filled 2017-08-21: qty 5

## 2017-08-21 MED ORDER — SODIUM CHLORIDE 0.9 % IR SOLN
Status: DC | PRN
  Administered 2017-08-21: 3000 mL

## 2017-08-21 MED ORDER — SCOPOLAMINE 1 MG/3DAYS TD PT72
1.0000 | MEDICATED_PATCH | Freq: Once | TRANSDERMAL | Status: DC
  Administered 2017-08-21: 1.5 mg via TRANSDERMAL

## 2017-08-21 MED ORDER — ROCURONIUM BROMIDE 10 MG/ML (PF) SYRINGE
PREFILLED_SYRINGE | INTRAVENOUS | Status: DC | PRN
  Administered 2017-08-21: 50 mg via INTRAVENOUS

## 2017-08-21 MED ORDER — PROPOFOL 10 MG/ML IV BOLUS
INTRAVENOUS | Status: AC
  Filled 2017-08-21: qty 20

## 2017-08-21 MED ORDER — PROMETHAZINE HCL 25 MG/ML IJ SOLN
6.2500 mg | INTRAMUSCULAR | Status: DC | PRN
  Administered 2017-08-21: 6.25 mg via INTRAVENOUS

## 2017-08-21 MED ORDER — OXYCODONE-ACETAMINOPHEN 5-325 MG PO TABS
1.0000 | ORAL_TABLET | ORAL | Status: DC | PRN
  Administered 2017-08-21: 1 via ORAL

## 2017-08-21 MED ORDER — PROPOFOL 10 MG/ML IV BOLUS
INTRAVENOUS | Status: DC | PRN
  Administered 2017-08-21: 200 mg via INTRAVENOUS

## 2017-08-21 MED ORDER — METHYLENE BLUE 0.5 % INJ SOLN
INTRAVENOUS | Status: DC | PRN
  Administered 2017-08-21: 5 mL via INTRAVENOUS

## 2017-08-21 MED ORDER — ONDANSETRON HCL 4 MG/2ML IJ SOLN
INTRAMUSCULAR | Status: AC
  Filled 2017-08-21: qty 2

## 2017-08-21 MED ORDER — SUGAMMADEX SODIUM 500 MG/5ML IV SOLN
INTRAVENOUS | Status: DC | PRN
  Administered 2017-08-21: 300 mg via INTRAVENOUS

## 2017-08-21 MED ORDER — METHYLENE BLUE 0.5 % INJ SOLN
INTRAVENOUS | Status: AC
  Filled 2017-08-21: qty 10

## 2017-08-21 MED ORDER — HYDROMORPHONE HCL 1 MG/ML IJ SOLN
0.2500 mg | INTRAMUSCULAR | Status: DC | PRN
  Administered 2017-08-21 (×2): 0.5 mg via INTRAVENOUS

## 2017-08-21 MED ORDER — SCOPOLAMINE 1 MG/3DAYS TD PT72
MEDICATED_PATCH | TRANSDERMAL | Status: AC
  Administered 2017-08-21: 1.5 mg via TRANSDERMAL
  Filled 2017-08-21: qty 1

## 2017-08-21 MED ORDER — PROMETHAZINE HCL 25 MG/ML IJ SOLN
INTRAMUSCULAR | Status: AC
  Filled 2017-08-21: qty 1

## 2017-08-21 MED ORDER — LIDOCAINE HCL (CARDIAC) PF 100 MG/5ML IV SOSY
PREFILLED_SYRINGE | INTRAVENOUS | Status: AC
Start: 2017-08-21 — End: ?
  Filled 2017-08-21: qty 5

## 2017-08-21 MED ORDER — MEPERIDINE HCL 25 MG/ML IJ SOLN
6.2500 mg | INTRAMUSCULAR | Status: DC | PRN
  Administered 2017-08-21 (×2): 12.5 mg via INTRAVENOUS

## 2017-08-21 MED ORDER — MEPERIDINE HCL 25 MG/ML IJ SOLN
INTRAMUSCULAR | Status: AC
  Administered 2017-08-21: 12.5 mg via INTRAVENOUS
  Filled 2017-08-21: qty 1

## 2017-08-21 MED ORDER — OXYCODONE-ACETAMINOPHEN 5-325 MG PO TABS: 1.0000 | ORAL_TABLET | Freq: Four times a day (QID) | ORAL | 0 refills | Status: DC | PRN

## 2017-08-21 MED ORDER — ROCURONIUM BROMIDE 100 MG/10ML IV SOLN
INTRAVENOUS | Status: AC
Start: 2017-08-21 — End: ?
  Filled 2017-08-21: qty 1

## 2017-08-21 MED ORDER — MIDAZOLAM HCL 5 MG/5ML IJ SOLN
INTRAMUSCULAR | Status: DC | PRN
  Administered 2017-08-21: 2 mg via INTRAVENOUS

## 2017-08-21 MED ORDER — BUPIVACAINE HCL (PF) 0.25 % IJ SOLN
INTRAMUSCULAR | Status: DC | PRN
  Administered 2017-08-21: 20 mL

## 2017-08-21 MED ORDER — ONDANSETRON HCL 4 MG/2ML IJ SOLN
INTRAMUSCULAR | Status: DC | PRN
  Administered 2017-08-21: 4 mg via INTRAVENOUS

## 2017-08-21 MED ORDER — LACTATED RINGERS IV SOLN
INTRAVENOUS | Status: DC
  Administered 2017-08-21 (×2): via INTRAVENOUS

## 2017-08-21 MED ORDER — MIDAZOLAM HCL 2 MG/2ML IJ SOLN
INTRAMUSCULAR | Status: AC
  Filled 2017-08-21: qty 2

## 2017-08-21 MED ORDER — SUGAMMADEX SODIUM 500 MG/5ML IV SOLN
INTRAVENOUS | Status: AC
  Filled 2017-08-21: qty 5

## 2017-08-21 MED ORDER — DEXAMETHASONE SODIUM PHOSPHATE 10 MG/ML IJ SOLN
INTRAMUSCULAR | Status: DC | PRN
  Administered 2017-08-21: 10 mg via INTRAVENOUS

## 2017-08-21 SURGICAL SUPPLY — 37 items
ADH SKN CLS APL DERMABOND .7 (GAUZE/BANDAGES/DRESSINGS) ×2
APL SRG 38 LTWT LNG FL B (MISCELLANEOUS) ×2
APPLICATOR ARISTA FLEXITIP XL (MISCELLANEOUS) ×1 IMPLANT
BAG SPEC RTRVL LRG 6X4 10 (ENDOMECHANICALS) ×2
BLADE SURG 15 STRL LF C SS BP (BLADE) ×2 IMPLANT
BLADE SURG 15 STRL SS (BLADE) ×3
CATH ROBINSON RED A/P 16FR (CATHETERS) ×1 IMPLANT
DERMABOND ADVANCED (GAUZE/BANDAGES/DRESSINGS) ×1
DERMABOND ADVANCED .7 DNX12 (GAUZE/BANDAGES/DRESSINGS) ×2 IMPLANT
DRSG OPSITE POSTOP 3X4 (GAUZE/BANDAGES/DRESSINGS) ×3 IMPLANT
DURAPREP 26ML APPLICATOR (WOUND CARE) ×3 IMPLANT
ELECT REM PT RETURN 9FT ADLT (ELECTROSURGICAL) ×3
ELECTRODE REM PT RTRN 9FT ADLT (ELECTROSURGICAL) IMPLANT
GLOVE BIOGEL PI IND STRL 6.5 (GLOVE) ×4 IMPLANT
GLOVE BIOGEL PI IND STRL 7.0 (GLOVE) ×4 IMPLANT
GLOVE BIOGEL PI INDICATOR 6.5 (GLOVE) ×2
GLOVE BIOGEL PI INDICATOR 7.0 (GLOVE) ×2
GLOVE SURG SS PI 6.0 STRL IVOR (GLOVE) ×3 IMPLANT
GOWN STRL REUS W/TWL LRG LVL3 (GOWN DISPOSABLE) ×6 IMPLANT
HEMOSTAT ARISTA ABSORB 3G PWDR (MISCELLANEOUS) ×2 IMPLANT
NS IRRIG 1000ML POUR BTL (IV SOLUTION) ×3 IMPLANT
PACK LAPAROSCOPY BASIN (CUSTOM PROCEDURE TRAY) ×3 IMPLANT
PACK TRENDGUARD 450 HYBRID PRO (MISCELLANEOUS) IMPLANT
POUCH SPECIMEN RETRIEVAL 10MM (ENDOMECHANICALS) ×1 IMPLANT
PROTECTOR NERVE ULNAR (MISCELLANEOUS) ×6 IMPLANT
SET IRRIG TUBING LAPAROSCOPIC (IRRIGATION / IRRIGATOR) ×1 IMPLANT
SHEARS HARMONIC ACE PLUS 36CM (ENDOMECHANICALS) ×1 IMPLANT
SLEEVE XCEL OPT CAN 5 100 (ENDOMECHANICALS) ×3 IMPLANT
SUT MNCRL AB 4-0 PS2 18 (SUTURE) ×3 IMPLANT
SUT VICRYL 0 UR6 27IN ABS (SUTURE) ×6 IMPLANT
TOWEL OR 17X24 6PK STRL BLUE (TOWEL DISPOSABLE) ×6 IMPLANT
TRAY FOLEY W/BAG SLVR 14FR (SET/KITS/TRAYS/PACK) ×3 IMPLANT
TRENDGUARD 450 HYBRID PRO PACK (MISCELLANEOUS) ×3
TROCAR BALLN 12MMX100 BLUNT (TROCAR) ×3 IMPLANT
TROCAR XCEL NON-BLD 5MMX100MML (ENDOMECHANICALS) ×3 IMPLANT
TUBING INSUF HEATED (TUBING) ×3 IMPLANT
WARMER LAPAROSCOPE (MISCELLANEOUS) ×3 IMPLANT

## 2017-08-21 NOTE — Anesthesia Preprocedure Evaluation (Signed)
Anesthesia Evaluation  Patient identified by MRN, date of birth, ID band Patient awake    Reviewed: Allergy & Precautions, NPO status , Patient's Chart, lab work & pertinent test results  Airway Mallampati: II       Dental no notable dental hx. (+) Teeth Intact   Pulmonary Current Smoker,    Pulmonary exam normal breath sounds clear to auscultation       Cardiovascular hypertension, Pt. on medications Normal cardiovascular exam Rhythm:Regular Rate:Normal     Neuro/Psych negative psych ROS   GI/Hepatic negative GI ROS, Neg liver ROS,   Endo/Other  diabetes, Type 2, Oral Hypoglycemic Agents  Renal/GU negative Renal ROS  negative genitourinary   Musculoskeletal negative musculoskeletal ROS (+)   Abdominal (+) + obese,   Peds  Hematology   Anesthesia Other Findings   Reproductive/Obstetrics                             Anesthesia Physical Anesthesia Plan  ASA: II  Anesthesia Plan: General   Post-op Pain Management:    Induction: Intravenous  PONV Risk Score and Plan: Ondansetron, Scopolamine patch - Pre-op and Midazolam  Airway Management Planned: Oral ETT  Additional Equipment:   Intra-op Plan:   Post-operative Plan: Extubation in OR  Informed Consent: I have reviewed the patients History and Physical, chart, labs and discussed the procedure including the risks, benefits and alternatives for the proposed anesthesia with the patient or authorized representative who has indicated his/her understanding and acceptance.   Dental advisory given  Plan Discussed with: CRNA and Surgeon  Anesthesia Plan Comments:         Anesthesia Quick Evaluation

## 2017-08-21 NOTE — Op Note (Signed)
Traci Butler PROCEDURE DATE: 08/21/2017  PREOPERATIVE DIAGNOSES: Persisten complex right adnexal mass POSTOPERATIVE DIAGNOSES: The same PROCEDURE: Laparoscopic right salpingoophorectomy, cystoscopy SURGEON:  Dr. Mora Bellman ASSISTANT: Dr. Ihor Dow   INDICATIONS: 42 y.o. G4P0 with aforementioned preoperative diagnoses here today for definitive surgical management.   Risks of surgery were discussed with the patient including but not limited to: bleeding which may require transfusion or reoperation; infection which may require antibiotics; injury to bowel, bladder, ureters or other surrounding organs; need for additional procedures including laparotomy; thromboembolic phenomenon, incisional problems and other postoperative/anesthesia complications. Written informed consent was obtained.    FINDINGS:  Small uterus, right adnexa with enlarged ovary and right hydrosalpinx. Normal left adnexa  .  No evidence of endometriosis.  Normal upper abdomen. Extensive adhesion of right adnexa to pelvic side wall. Lysis of adhesion was performed  ANESTHESIA:    General INTRAVENOUS FLUIDS: 1000 ml ESTIMATED BLOOD LOSS: 50 ml SPECIMENS:  right ovary and fallopian tube COMPLICATIONS: None immediate   PROCEDURE IN DETAIL:  The patient was taken to the operating room where general anesthesia was administered and was found to be adequate.  She was placed in the dorsal lithotomy position, and was prepped and draped in a sterile manner.  A Foley catheter was inserted into her bladder and attached to Karlos Scadden drainage and a uterine manipulator was then advanced into the uterus .  After an adequate timeout was performed, attention was then turned to the patient's abdomen where a 11-mm skin incision was made in the umbilical fold.  The fascia was identified, grasped with Kocher clamps, incised and tagged with 0-Vicryl.  The 11-mm trocar and sleeve were then advanced without difficulty into the abdomen without  difficulty and intraabdominal placement was confirmed by the laparoscope. A survey of the patient's pelvis and abdomen revealed the findings above.   Bilateral 5-mm lower quadrant ports  were then placed under direct visualization.   On the right side, the uteroovarian ligament was then clamped and transected with the Harmonic device.  The right infundibulopelvic ligament was also clamped and transected allowing for salpingooophorectomy.  Lysis of adhesion was performed to separate the mass from the pelvic side wall. Excellent hemostasis was noted. The specimen was then removed from the abdomen through the 11-mm port using an Endocatch bag, under direct visualization.  The operative site was surveyed, and it was found to be hemostatic. Arista was applied over the surgical site to ensure persistent of hemostasis. Cystoscopy was performed which showed bilateral ureteral jets after intravenous methelene blue administration.No intraoperative injury to other surrounding organs was noted.  The abdomen was desufflated and all instruments were then removed from the patient's abdomen. The fascial incision of the umbilicus was closed with a 0 Vicryl figure of eight stitch.  All skin incisions were closed with 3-0 Vicryl subcuticular stitches/Dermabond.   The patient will be discharged to home as per PACU criteria.  Routine postoperative instructions given.  She was prescribed Percocet, Ibuprofen and Colace.  She will follow up in the clinic in 2 weeks for postoperative evaluation.

## 2017-08-21 NOTE — H&P (Signed)
Traci Butler is an 42 y.o. female with complex right ovarian cyst here for surgical management. Patient is doing well and is without complaints  Pertinent Gynecological History: Menses: regular every month without intermenstrual spotting DES exposure: denies Blood transfusions: none Sexually transmitted diseases: no past history Last pap: normal Date: 04/2017 OB History:  P2   Menstrual History: Patient's last menstrual period was 08/20/2017.    Past Medical History:  Diagnosis Date  . Diabetes mellitus without complication ([place])   . High cholesterol   . Hypertension     Past Surgical History:  Procedure Laterality Date  . APPENDECTOMY  2008  . CESAREAN SECTION  2005   live birth  . CESAREAN SECTION  2011   live birth  . TONSILLECTOMY    . WISDOM TOOTH EXTRACTION  2015    Family History  Problem Relation Age of Onset  . Diabetes Mother   . Hypertension Father     Social History:  reports that she has been smoking.  She has a 12.50 pack-year smoking history. She has never used smokeless tobacco. She reports that she drinks about 1.2 oz of alcohol per week. She reports that she does not use drugs.  Allergies: No Known Allergies  Medications Prior to Admission  Medication Sig Dispense Refill Last Dose  . gabapentin (NEURONTIN) 300 MG capsule Take 1 capsule (300 mg total) by mouth at bedtime. 90 capsule 3 Past Week at Unknown time  . liraglutide (VICTOZA) 18 MG/3ML SOPN Inject 0.2 mLs (1.2 mg total) into the skin daily. 9 pen 1 08/21/2017 at Unknown time  . losartan-hydrochlorothiazide (HYZAAR) 50-12.5 MG tablet TAKE 1 TABLET BY MOUTH EVERY DAY 90 tablet 1 08/20/2017 at Unknown time  . metFORMIN (GLUCOPHAGE) 500 MG tablet TAKE 2 TABLETS BY MOUTH 2 TIMES DAILY WITH A MEAL. 180 tablet 1 08/20/2017 at Unknown time  . rosuvastatin (CRESTOR) 20 MG tablet TAKE 1 TABLET BY MOUTH EVERY DAY 90 tablet 0 08/21/2017 at Unknown time  . BAYER MICROLET LANCETS lancets Use as instructed  two times daily 100 each 9 Taking  . Blood Glucose Monitoring Suppl (CONTOUR NEXT ONE) KIT 1 each by Does not apply route 2 (two) times daily. 1 kit 1 Taking  . glucose blood (CONTOUR NEXT TEST) test strip Use as instructed two times daily 100 each 9 Taking  . Insulin Pen Needle (PEN NEEDLES) 32G X 4 MM MISC 1 each by Does not apply route daily. 100 each 4 Taking    ROS See pertinent in HPI Blood pressure (!) 140/93, pulse 88, temperature 97.7 F (36.5 C), temperature source Oral, resp. rate 16, height _0  (1.676 m), weight 231 lb (104.8 kg), last menstrual period 08/20/2017, SpO2 100 %. Physical Exam GENERAL: Well-developed, well-nourished female in no acute distress.  HEENT: Normocephalic, atraumatic. Sclerae anicteric.  NECK: Supple. Normal thyroid.  LUNGS: Clear to auscultation bilaterally.  HEART: Regular rate and rhythm. ABDOMEN: Soft, nontender, nondistended. No organomegaly. PELVIC: Deferred to OR EXTREMITIES: No cyanosis, clubbing, or edema, 2+ distal pulses.  Results for orders placed or performed during the hospital encounter of 08/21/17 (from the past 24 hour(s))  Pregnancy, urine     Status: None   Collection Time: 08/21/17  6:00 AM  Result Value Ref Range   Preg Test, Ur NEGATIVE NEGATIVE    No results found. FINDINGS: Uterus  Measurements: 10.5 x 5.8 x 5.8 cm. The myometrium is heterogeneous in appearance with focal areas of posterior acoustic shadowing.  Endometrium  Thickness: 9  mm. No focal abnormality visualized.  Right ovary  Measurements: 5.5 x 4.2 x 6.0 cm. Within the right ovary there is a 6.0 x 3.6 x 4.8 cm cystic mass with thick internal septations, nodularity and associated vascularity.  Left ovary  Measurements: 4.2 x 2.0 x 2.9 cm. Normal appearance/no adnexal mass.  Other findings  No abnormal free fluid.  IMPRESSION: Complex cystic mass with septations and small peripheral nodularity in the right ovary. Given the overall  appearance, surgical consultation is warranted.  These results will be called to the ordering clinician or representative by the Radiologist Assistant, and communication documented in the PACS or zVision Dashboard.   Electronically Signed By: Lovey Newcomer M.D. On: 06/28/2017 10:33   Assessment/Plan: 41 yo with complex right ovarian cyst here for surgical removal via laparoscopy - Risks, benefits and alternatives were explained including but not limited to risks of bleeding, infection and damage to adjacent organs. Patient verbalized understanding and all questions were answered  Traci Butler 08/21/2017, 7:35 AM

## 2017-08-21 NOTE — Discharge Instructions (Signed)

## 2017-08-21 NOTE — Anesthesia Postprocedure Evaluation (Signed)
Anesthesia Post Note  Patient: Traci Butler  Procedure(s) Performed: LAPAROSCOPIC RIGHT SALPINGO OOPHORECTOMY (Right Abdomen) CYSTOSCOPY (N/A Bladder)     Patient location during evaluation: PACU Anesthesia Type: General Level of consciousness: awake Pain management: pain level controlled Vital Signs Assessment: post-procedure vital signs reviewed and stable Respiratory status: spontaneous breathing Cardiovascular status: stable Postop Assessment: no apparent nausea or vomiting Anesthetic complications: no    Last Vitals:  Vitals:   08/21/17 1045 08/21/17 1056  BP: 125/79 136/86  Pulse: 82 79  Resp: 17 (!) 22  Temp:    SpO2: 97% 96%    Last Pain:  Vitals:   08/21/17 1030  TempSrc:   PainSc: Asleep   Pain Goal: Patients Stated Pain Goal: 3 (08/21/17 0626)               Yehia Mcbain JR,JOHN Mateo Flow

## 2017-08-21 NOTE — Anesthesia Procedure Notes (Signed)
Procedure Name: Intubation Date/Time: 08/21/2017 7:46 AM Performed by: Talbot Grumbling, CRNA Pre-anesthesia Checklist: Patient identified, Emergency Drugs available, Suction available and Patient being monitored Patient Re-evaluated:Patient Re-evaluated prior to induction Oxygen Delivery Method: Circle system utilized Preoxygenation: Pre-oxygenation with 100% oxygen Induction Type: IV induction Ventilation: Mask ventilation without difficulty Laryngoscope Size: Mac and 3 Grade View: Grade I Tube type: Oral Tube size: 7.5 mm Number of attempts: 1 Airway Equipment and Method: Stylet Placement Confirmation: ETT inserted through vocal cords under direct vision,  positive ETCO2 and breath sounds checked- equal and bilateral Secured at: 22 cm Tube secured with: Tape Dental Injury: Teeth and Oropharynx as per pre-operative assessment

## 2017-08-21 NOTE — Transfer of Care (Signed)
Immediate Anesthesia Transfer of Care Note  Patient: Traci Butler  Procedure(s) Performed: LAPAROSCOPIC RIGHT SALPINGO OOPHORECTOMY (Right Abdomen) CYSTOSCOPY (N/A Bladder)  Patient Location: PACU  Anesthesia Type:General  Level of Consciousness: sedated  Airway & Oxygen Therapy: Patient Spontanous Breathing and Patient connected to nasal cannula oxygen  Post-op Assessment: Report given to RN and Post -op Vital signs reviewed and stable  Post vital signs: Reviewed and stable  Last Vitals:  Vitals Value Taken Time  BP    Temp    Pulse 114 08/21/2017  9:42 AM  Resp 21 08/21/2017  9:42 AM  SpO2 96 % 08/21/2017  9:42 AM  Vitals shown include unvalidated device data.  Last Pain:  Vitals:   08/21/17 0626  TempSrc: Oral      Patients Stated Pain Goal: 3 (24/93/24 1991)  Complications: No apparent anesthesia complications

## 2017-08-22 ENCOUNTER — Encounter (HOSPITAL_COMMUNITY): Payer: Self-pay | Admitting: Obstetrics and Gynecology

## 2017-08-23 ENCOUNTER — Other Ambulatory Visit: Payer: Self-pay | Admitting: Internal Medicine

## 2017-08-23 DIAGNOSIS — E782 Mixed hyperlipidemia: Principal | ICD-10-CM

## 2017-08-23 DIAGNOSIS — E1169 Type 2 diabetes mellitus with other specified complication: Secondary | ICD-10-CM

## 2017-08-28 ENCOUNTER — Inpatient Hospital Stay (HOSPITAL_COMMUNITY)
Admission: AD | Admit: 2017-08-28 | Discharge: 2017-08-28 | Disposition: A | Discharge status: Home / Self Care | Payer: BLUE CROSS/BLUE SHIELD | Source: Ambulatory Visit | Attending: Obstetrics and Gynecology | Admitting: Obstetrics and Gynecology

## 2017-08-28 ENCOUNTER — Inpatient Hospital Stay (HOSPITAL_COMMUNITY): Payer: BLUE CROSS/BLUE SHIELD

## 2017-08-28 ENCOUNTER — Encounter (HOSPITAL_COMMUNITY): Payer: Self-pay

## 2017-08-28 ENCOUNTER — Other Ambulatory Visit: Payer: Self-pay

## 2017-08-28 DIAGNOSIS — F17210 Nicotine dependence, cigarettes, uncomplicated: Secondary | ICD-10-CM | POA: Insufficient documentation

## 2017-08-28 DIAGNOSIS — Z79899 Other long term (current) drug therapy: Secondary | ICD-10-CM | POA: Insufficient documentation

## 2017-08-28 DIAGNOSIS — Z9889 Other specified postprocedural states: Secondary | ICD-10-CM | POA: Insufficient documentation

## 2017-08-28 DIAGNOSIS — E119 Type 2 diabetes mellitus without complications: Secondary | ICD-10-CM | POA: Diagnosis not present

## 2017-08-28 DIAGNOSIS — E7800 Pure hypercholesterolemia, unspecified: Secondary | ICD-10-CM | POA: Insufficient documentation

## 2017-08-28 DIAGNOSIS — K5903 Drug induced constipation: Secondary | ICD-10-CM

## 2017-08-28 DIAGNOSIS — R112 Nausea with vomiting, unspecified: Secondary | ICD-10-CM | POA: Insufficient documentation

## 2017-08-28 DIAGNOSIS — I10 Essential (primary) hypertension: Secondary | ICD-10-CM | POA: Insufficient documentation

## 2017-08-28 DIAGNOSIS — K5909 Other constipation: Secondary | ICD-10-CM | POA: Diagnosis not present

## 2017-08-28 DIAGNOSIS — R109 Unspecified abdominal pain: Secondary | ICD-10-CM | POA: Insufficient documentation

## 2017-08-28 DIAGNOSIS — Z794 Long term (current) use of insulin: Secondary | ICD-10-CM | POA: Insufficient documentation

## 2017-08-28 LAB — CBC WITH DIFFERENTIAL/PLATELET
Basophils Absolute: 0 10*3/uL (ref 0.0–0.1)
Basophils Relative: 0 %
Eosinophils Absolute: 0.3 10*3/uL (ref 0.0–0.7)
Eosinophils Relative: 2 %
HCT: 34.8 % — ABNORMAL LOW (ref 36.0–46.0)
Hemoglobin: 11.9 g/dL — ABNORMAL LOW (ref 12.0–15.0)
Lymphocytes Relative: 24 %
Lymphs Abs: 3.3 10*3/uL (ref 0.7–4.0)
MCH: 28.3 pg (ref 26.0–34.0)
MCHC: 34.2 g/dL (ref 30.0–36.0)
MCV: 82.7 fL (ref 78.0–100.0)
Monocytes Absolute: 0.6 10*3/uL (ref 0.1–1.0)
Monocytes Relative: 5 %
Neutro Abs: 9.4 10*3/uL — ABNORMAL HIGH (ref 1.7–7.7)
Neutrophils Relative %: 69 %
Platelets: 432 10*3/uL — ABNORMAL HIGH (ref 150–400)
RBC: 4.21 MIL/uL (ref 3.87–5.11)
RDW: 13.5 % (ref 11.5–15.5)
WBC: 13.7 10*3/uL — ABNORMAL HIGH (ref 4.0–10.5)

## 2017-08-28 LAB — COMPREHENSIVE METABOLIC PANEL
ALT: 10 U/L (ref 0–44)
AST: 14 U/L — ABNORMAL LOW (ref 15–41)
Albumin: 4.1 g/dL (ref 3.5–5.0)
Alkaline Phosphatase: 57 U/L (ref 38–126)
Anion gap: 13 (ref 5–15)
BUN: 12 mg/dL (ref 6–20)
CO2: 24 mmol/L (ref 22–32)
Calcium: 9.4 mg/dL (ref 8.9–10.3)
Chloride: 97 mmol/L — ABNORMAL LOW (ref 98–111)
Creatinine, Ser: 1.38 mg/dL — ABNORMAL HIGH (ref 0.44–1.00)
GFR calc Af Amer: 54 mL/min — ABNORMAL LOW (ref >60)
GFR calc non Af Amer: 47 mL/min — ABNORMAL LOW (ref >60)
Glucose, Bld: 164 mg/dL — ABNORMAL HIGH (ref 70–99)
Potassium: 3.9 mmol/L (ref 3.5–5.1)
Sodium: 134 mmol/L — ABNORMAL LOW (ref 135–145)
Total Bilirubin: 0.6 mg/dL (ref 0.3–1.2)
Total Protein: 7.6 g/dL (ref 6.5–8.1)

## 2017-08-28 LAB — URINALYSIS, ROUTINE W REFLEX MICROSCOPIC
Bilirubin Urine: NEGATIVE
Glucose, UA: NEGATIVE mg/dL
Hgb urine dipstick: NEGATIVE
Ketones, ur: NEGATIVE mg/dL
Leukocytes, UA: NEGATIVE
Nitrite: NEGATIVE
Protein, ur: NEGATIVE mg/dL
Specific Gravity, Urine: 1.01 (ref 1.005–1.030)
pH: 7 (ref 5.0–8.0)

## 2017-08-28 MED ORDER — HYDROMORPHONE HCL 1 MG/ML IJ SOLN
1.0000 mg | Freq: Once | INTRAMUSCULAR | Status: AC
  Administered 2017-08-28: 1 mg via INTRAVENOUS
  Filled 2017-08-28: qty 1

## 2017-08-28 MED ORDER — POLYETHYLENE GLYCOL 3350 17 G PO PACK: 17.0000 g | PACK | Freq: Every day | ORAL | 0 refills | Status: DC

## 2017-08-28 NOTE — Discharge Instructions (Signed)
High-Fiber Diet  Fiber, also called dietary fiber, is a type of carbohydrate found in fruits, vegetables, whole grains, and beans. A high-fiber diet can have many health benefits. Your health care provider may recommend a high-fiber diet to help:  · Prevent constipation. Fiber can make your bowel movements more regular.  · Lower your cholesterol.  · Relieve hemorrhoids, uncomplicated diverticulosis, or irritable bowel syndrome.  · Prevent overeating as part of a weight-loss plan.  · Prevent heart disease, type 2 diabetes, and certain cancers.    What is my plan?  The recommended daily intake of fiber includes:  · 38 grams for men under age 50.  · 30 grams for men over age 50.  · 25 grams for women under age 50.  · 21 grams for women over age 50.    You can get the recommended daily intake of dietary fiber by eating a variety of fruits, vegetables, grains, and beans. Your health care provider may also recommend a fiber supplement if it is not possible to get enough fiber through your diet.  What do I need to know about a high-fiber diet?  · Fiber supplements have not been widely studied for their effectiveness, so it is better to get fiber through food sources.  · Always check the fiber content on the nutrition facts label of any prepackaged food. Look for foods that contain at least 5 grams of fiber per serving.  · Ask your dietitian if you have questions about specific foods that are related to your condition, especially if those foods are not listed in the following section.  · Increase your daily fiber consumption gradually. Increasing your intake of dietary fiber too quickly may cause bloating, cramping, or gas.  · Drink plenty of water. Water helps you to digest fiber.  What foods can I eat?  Grains  Whole-grain breads. Multigrain cereal. Oats and oatmeal. Brown rice. Barley. Bulgur wheat. Millet. Bran muffins. Popcorn. Rye wafer crackers.  Vegetables   Sweet potatoes. Spinach. Kale. Artichokes. Cabbage. Broccoli. Green peas. Carrots. Squash.  Fruits  Berries. Pears. Apples. Oranges. Avocados. Prunes and raisins. Dried figs.  Meats and Other Protein Sources  Navy, kidney, pinto, and soy beans. Split peas. Lentils. Nuts and seeds.  Dairy  Fiber-fortified yogurt.  Beverages  Fiber-fortified soy milk. Fiber-fortified orange juice.  Other  Fiber bars.  The items listed above may not be a complete list of recommended foods or beverages. Contact your dietitian for more options.  What foods are not recommended?  Grains  White bread. Pasta made with refined flour. White rice.  Vegetables  Fried potatoes. Canned vegetables. Well-cooked vegetables.  Fruits  Fruit juice. Cooked, strained fruit.  Meats and Other Protein Sources  Fatty cuts of meat. Fried poultry or fried fish.  Dairy  Milk. Yogurt. Cream cheese. Sour cream.  Beverages  Soft drinks.  Other  Cakes and pastries. Butter and oils.  The items listed above may not be a complete list of foods and beverages to avoid. Contact your dietitian for more information.  What are some tips for including high-fiber foods in my diet?  · Eat a wide variety of high-fiber foods.  · Make sure that half of all grains consumed each day are whole grains.  · Replace breads and cereals made from refined flour or white flour with whole-grain breads and cereals.  · Replace white rice with brown rice, bulgur wheat, or millet.  · Start the day with a breakfast that is high in fiber,   such as a cereal that contains at least 5 grams of fiber per serving.  · Use beans in place of meat in soups, salads, or pasta.  · Eat high-fiber snacks, such as berries, raw vegetables, nuts, or popcorn.  This information is not intended to replace advice given to you by your health care provider. Make sure you discuss any questions you have with your health care provider.  Document Released: 01/09/2005 Document Revised: 06/17/2015 Document Reviewed: 06/24/2013   Elsevier Interactive Patient Education © 2018 [employer]

## 2017-08-28 NOTE — MAU Note (Addendum)
Pt. States that she had Right Fallopian tube due to a cyst. Reports pain 10/10 in right side through middle of the abd. States no BM since 08/20/17. Reports N/V since yesterday.

## 2017-08-28 NOTE — MAU Note (Signed)
Pt given 500 cc of soap suds enema and had fair results

## 2017-08-28 NOTE — MAU Note (Signed)
Pt given soap suds enema, pt retained 350 cc of fluid with fair results.

## 2017-08-28 NOTE — MAU Provider Note (Addendum)
Chief Complaint: Abdominal Pain   First Provider Initiated Contact with Patient 08/28/17 [phone]      SUBJECTIVE HPI: Traci Butler is a 42 y.o. G4P0 not currently pregnant who presents to maternity admissions reporting abdominal pain, nausea and vomiting. She is 1 week postop laparoscopic right salpingoophorectomy that was done on 7/30. She reports last night waking up out of her sleep in severe abdominal pain. She reports pain is specific to her right lateral abdomen that radiates across to her umbilicus. She describes pain as sharp ache, rates pain 10/10- has taken prescribed Percocet and Ibuprofen for pain with no relief. She reports pain is associated with nausea and vomiting that began yesterday when the pain increased. She reports no BM has occurred since prior to surgery around 7/29. She is taking colace as prescribed since surgery. With urge to have a bowel movement but no outcome. She denies headache, fever/chills. Has postop appointment scheduled for next week.   Past Medical History:  Diagnosis Date  . Diabetes mellitus without complication ([place])   . High cholesterol   . Hypertension    Past Surgical History:  Procedure Laterality Date  . APPENDECTOMY  2008  . CESAREAN SECTION  2005   live birth  . CESAREAN SECTION  2011   live birth  . CYSTOSCOPY N/A 08/21/2017   Procedure: CYSTOSCOPY;  Surgeon: Mora Bellman, MD;  Location: [facility];  Service: Gynecology;  Laterality: N/A;  . LAPAROSCOPIC SALPINGO OOPHERECTOMY Right 08/21/2017   Procedure: LAPAROSCOPIC RIGHT SALPINGO OOPHORECTOMY;  Surgeon: Mora Bellman, MD;  Location: [facility];  Service: Gynecology;  Laterality: Right;  . TONSILLECTOMY    . WISDOM TOOTH EXTRACTION  2015   Social History   Socioeconomic History  . Marital status: Single    Spouse name: Not on file  . Number of children: Not on file  . Years of education: Not on file  . Highest education level: Not on file  Occupational History  . Not on file  Social  Needs  . Financial resource strain: Not on file  . Food insecurity:    Worry: Not on file    Inability: Not on file  . Transportation needs:    Medical: Not on file    Non-medical: Not on file  Tobacco Use  . Smoking status: Current Some Day Smoker    Packs/day: 0.50    Years: 25.00    Pack years: 12.50  . Smokeless tobacco: Never Used  . Tobacco comment: trying  Substance and Sexual Activity  . Alcohol use: Yes    Alcohol/week: 1.2 oz    Types: 2 Shots of liquor per week    Comment: WEEKENDS ONLY  . Drug use: No  . Sexual activity: Yes  Lifestyle  . Physical activity:    Days per week: Not on file    Minutes per session: Not on file  . Stress: Not on file  Relationships  . Social connections:    Talks on phone: Not on file    Gets together: Not on file    Attends religious service: Not on file    Active member of club or organization: Not on file    Attends meetings of clubs or organizations: Not on file    Relationship status: Not on file  . Intimate partner violence:    Fear of current or ex partner: Not on file    Emotionally abused: Not on file    Physically abused: Not on file    Forced sexual  activity: Not on file  Other Topics Concern  . Not on file  Social History Narrative  . Not on file   No current facility-administered medications on file prior to encounter.    Current Outpatient Medications on File Prior to Encounter  Medication Sig Dispense Refill  . BAYER MICROLET LANCETS lancets Use as instructed two times daily 100 each 9  . Blood Glucose Monitoring Suppl (CONTOUR NEXT ONE) KIT 1 each by Does not apply route 2 (two) times daily. 1 kit 1  . docusate sodium (COLACE) 100 MG capsule Take 1 capsule (100 mg total) by mouth 2 (two) times daily. 30 capsule 2  . gabapentin (NEURONTIN) 300 MG capsule Take 1 capsule (300 mg total) by mouth at bedtime. 90 capsule 3  . glucose blood (CONTOUR NEXT TEST) test strip Use as instructed two times daily 100 each 9   . ibuprofen (ADVIL,MOTRIN) 600 MG tablet Take 1 tablet (600 mg total) by mouth every 6 (six) hours as needed. 60 tablet 3  . Insulin Pen Needle (PEN NEEDLES) 32G X 4 MM MISC 1 each by Does not apply route daily. 100 each 4  . liraglutide (VICTOZA) 18 MG/3ML SOPN Inject 0.2 mLs (1.2 mg total) into the skin daily. 9 pen 1  . losartan-hydrochlorothiazide (HYZAAR) 50-12.5 MG tablet TAKE 1 TABLET BY MOUTH EVERY DAY 90 tablet 1  . metFORMIN (GLUCOPHAGE) 500 MG tablet TAKE 2 TABLETS BY MOUTH 2 TIMES DAILY WITH A MEAL. 180 tablet 1  . oxyCODONE-acetaminophen (PERCOCET/ROXICET) 5-325 MG tablet Take 1-2 tablets by mouth every 6 (six) hours as needed. 20 tablet 0  . rosuvastatin (CRESTOR) 20 MG tablet TAKE 1 TABLET BY MOUTH EVERY DAY 90 tablet 0   No Known Allergies  ROS:  Review of Systems  Constitutional: Negative.   Respiratory: Negative.   Cardiovascular: Negative.   Gastrointestinal: Positive for abdominal pain, constipation, nausea and vomiting. Negative for diarrhea.  Genitourinary: Positive for flank pain. Negative for difficulty urinating, dysuria, urgency, vaginal bleeding and vaginal discharge.       Right flank pain  Neurological: Negative.    I have reviewed patient's Past Medical Hx, Surgical Hx, Family Hx, Social Hx, medications and allergies.   Physical Exam   Patient Vitals for the past 24 hrs:  BP Temp Temp src Pulse Resp Height Weight  08/28/17 0552 - 98.5 F (36.9 C) Oral - - 5' 6"  (1.676 m) 230 lb (104.3 kg)  08/28/17 0544 (!) 151/87 - - 75 18 - -   Constitutional: Well-developed, obese female in no acute distress.  Cardiovascular: normal rate Respiratory: normal effort GI: Abd soft, tender on palpation in right abdomen, no rebound tenderness, negative Murphy's sign. Pos BS x 4- hyperactive  MS: Extremities nontender, no edema, normal ROM Neurologic: Alert and oriented x 4.  GU: Neg CVAT.  LAB RESULTS Results for orders placed or performed during the hospital  encounter of 08/28/17 (from the past 24 hour(s))  Urinalysis, Routine w reflex microscopic     Status: None   Collection Time: 08/28/17  5:49 AM  Result Value Ref Range   Color, Urine YELLOW YELLOW   APPearance CLEAR CLEAR   Specific Gravity, Urine 1.010 1.005 - 1.030   pH 7.0 5.0 - 8.0   Glucose, UA NEGATIVE NEGATIVE mg/dL   Hgb urine dipstick NEGATIVE NEGATIVE   Bilirubin Urine NEGATIVE NEGATIVE   Ketones, ur NEGATIVE NEGATIVE mg/dL   Protein, ur NEGATIVE NEGATIVE mg/dL   Nitrite NEGATIVE NEGATIVE   Leukocytes, UA  NEGATIVE NEGATIVE  Comprehensive metabolic panel     Status: Abnormal   Collection Time: 08/28/17  6:41 AM  Result Value Ref Range   Sodium 134 (L) 135 - 145 mmol/L   Potassium 3.9 3.5 - 5.1 mmol/L   Chloride 97 (L) 98 - 111 mmol/L   CO2 24 22 - 32 mmol/L   Glucose, Bld 164 (H) 70 - 99 mg/dL   BUN 12 6 - 20 mg/dL   Creatinine, Ser 1.38 (H) 0.44 - 1.00 mg/dL   Calcium 9.4 8.9 - 10.3 mg/dL   Total Protein 7.6 6.5 - 8.1 g/dL   Albumin 4.1 3.5 - 5.0 g/dL   AST 14 (L) 15 - 41 U/L   ALT 10 0 - 44 U/L   Alkaline Phosphatase 57 38 - 126 U/L   Total Bilirubin 0.6 0.3 - 1.2 mg/dL   GFR calc non Af Amer 47 (L) >60 mL/min   GFR calc Af Amer 54 (L) >60 mL/min   Anion gap 13 5 - 15  CBC with Differential     Status: Abnormal   Collection Time: 08/28/17  6:41 AM  Result Value Ref Range   WBC 13.7 (H) 4.0 - 10.5 K/uL   RBC 4.21 3.87 - 5.11 MIL/uL   Hemoglobin 11.9 (L) 12.0 - 15.0 g/dL   HCT 34.8 (L) 36.0 - 46.0 %   MCV 82.7 78.0 - 100.0 fL   MCH 28.3 26.0 - 34.0 pg   MCHC 34.2 30.0 - 36.0 g/dL   RDW 13.5 11.5 - 15.5 %   Platelets 432 (H) 150 - 400 K/uL   Neutrophils Relative % 69 %   Neutro Abs 9.4 (H) 1.7 - 7.7 K/uL   Lymphocytes Relative 24 %   Lymphs Abs 3.3 0.7 - 4.0 K/uL   Monocytes Relative 5 %   Monocytes Absolute 0.6 0.1 - 1.0 K/uL   Eosinophils Relative 2 %   Eosinophils Absolute 0.3 0.0 - 0.7 K/uL   Basophils Relative 0 %   Basophils Absolute 0.0 0.0 -  0.1 K/uL    --/--/O POS, O POS Performed at [facility], [address], [place], [place] [address]  [phone])  IMAGING Dg Abdomen 1 View  Result Date: 08/28/2017 CLINICAL DATA:  Postoperative abdominal pain. One week postop from right salpingo oophorectomy. EXAM: ABDOMEN - 1 VIEW COMPARISON:  None. FINDINGS: No evidence of dilated bowel loops. Moderate amount of stool is seen throughout the colon. No radiopaque calculi identified. IMPRESSION: Unremarkable bowel gas pattern.  No acute findings. Electronically Signed   By: Earle Gell M.D.   On: 08/28/2017 07:11    MAU Management/MDM: Orders Placed This Encounter  Procedures  . DG Abdomen 1 View  . Urinalysis, Routine w reflex microscopic  . Comprehensive metabolic panel  . CBC with Differential  . Soap suds enema  . Saline lock IV   UA- negative  CBC and CMP- WNL   Meds ordered this encounter  Medications  . HYDROmorphone (DILAUDID) injection 1 mg   Treatments in MAU included 60m Dilaudid patient reports slight decrease in abdominal pain to 9/10. Xray noted no abnormalities or dilated bowel loops. Xray showed large amounts of stool- soap suds enema initiated.  Report given to SReid Hope Kingat [phone]  VDarrol Poke Certified Nurse-Midwife 08/28/2017  7:56 AM  ASSESSMENT/PLAN  --42y.o. non pregnant patient s/p laparoscopic right salpingoophorectomy 08/21/2017 --Severe constipation related to medication regimen and diet --Multiple bowel movements in MAU with soap suds enema --  Reviewed fiber-rich food sources (see AVS) --Rx Miralax, focus on PO hydration with water --Discharge home in stable condition  Post Op F/U scheduled for 09/10/2017  Mallie Snooks, CNM 08/28/17  9:44 AM

## 2017-09-01 ENCOUNTER — Inpatient Hospital Stay (HOSPITAL_COMMUNITY): Payer: BLUE CROSS/BLUE SHIELD | Admitting: Anesthesiology

## 2017-09-01 ENCOUNTER — Encounter (HOSPITAL_COMMUNITY)
Admission: AD | Disposition: A | Discharge status: Home / Self Care | Payer: Self-pay | Source: Ambulatory Visit | Attending: Urology

## 2017-09-01 ENCOUNTER — Encounter (HOSPITAL_COMMUNITY): Payer: Self-pay | Admitting: *Deleted

## 2017-09-01 ENCOUNTER — Observation Stay (HOSPITAL_COMMUNITY)
Admission: AD | Admit: 2017-09-01 | Discharge: 2017-09-03 | Disposition: A | Discharge status: Home / Self Care | Payer: BLUE CROSS/BLUE SHIELD | Source: Ambulatory Visit | Attending: Urology | Admitting: Urology

## 2017-09-01 ENCOUNTER — Other Ambulatory Visit: Payer: Self-pay

## 2017-09-01 ENCOUNTER — Observation Stay (HOSPITAL_COMMUNITY): Payer: BLUE CROSS/BLUE SHIELD

## 2017-09-01 ENCOUNTER — Inpatient Hospital Stay (HOSPITAL_COMMUNITY): Payer: BLUE CROSS/BLUE SHIELD

## 2017-09-01 DIAGNOSIS — N131 Hydronephrosis with ureteral stricture, not elsewhere classified: Secondary | ICD-10-CM | POA: Diagnosis present

## 2017-09-01 DIAGNOSIS — Z7984 Long term (current) use of oral hypoglycemic drugs: Secondary | ICD-10-CM | POA: Diagnosis not present

## 2017-09-01 DIAGNOSIS — T8189XA Other complications of procedures, not elsewhere classified, initial encounter: Secondary | ICD-10-CM | POA: Diagnosis not present

## 2017-09-01 DIAGNOSIS — F17210 Nicotine dependence, cigarettes, uncomplicated: Secondary | ICD-10-CM | POA: Insufficient documentation

## 2017-09-01 DIAGNOSIS — Y836 Removal of other organ (partial) (total) as the cause of abnormal reaction of the patient, or of later complication, without mention of misadventure at the time of the procedure: Secondary | ICD-10-CM | POA: Diagnosis not present

## 2017-09-01 DIAGNOSIS — E119 Type 2 diabetes mellitus without complications: Secondary | ICD-10-CM | POA: Insufficient documentation

## 2017-09-01 DIAGNOSIS — X58XXXA Exposure to other specified factors, initial encounter: Secondary | ICD-10-CM | POA: Diagnosis not present

## 2017-09-01 DIAGNOSIS — M545 Low back pain: Secondary | ICD-10-CM

## 2017-09-01 DIAGNOSIS — S3710XA Unspecified injury of ureter, initial encounter: Secondary | ICD-10-CM | POA: Diagnosis present

## 2017-09-01 DIAGNOSIS — Z79899 Other long term (current) drug therapy: Secondary | ICD-10-CM | POA: Insufficient documentation

## 2017-09-01 DIAGNOSIS — E7800 Pure hypercholesterolemia, unspecified: Secondary | ICD-10-CM | POA: Insufficient documentation

## 2017-09-01 DIAGNOSIS — I10 Essential (primary) hypertension: Secondary | ICD-10-CM | POA: Insufficient documentation

## 2017-09-01 DIAGNOSIS — N132 Hydronephrosis with renal and ureteral calculous obstruction: Secondary | ICD-10-CM | POA: Diagnosis present

## 2017-09-01 HISTORY — PX: CYSTOSCOPY W/ URETERAL STENT PLACEMENT: SHX1429

## 2017-09-01 HISTORY — PX: IR NEPHROSTOMY PLACEMENT RIGHT: IMG6064

## 2017-09-01 LAB — URINALYSIS, ROUTINE W REFLEX MICROSCOPIC
Bilirubin Urine: NEGATIVE
GLUCOSE, UA: NEGATIVE mg/dL
Hgb urine dipstick: NEGATIVE
KETONES UR: NEGATIVE mg/dL
Leukocytes, UA: NEGATIVE
Nitrite: NEGATIVE
PROTEIN: NEGATIVE mg/dL
Specific Gravity, Urine: 1.013 (ref 1.005–1.030)
pH: 6 (ref 5.0–8.0)

## 2017-09-01 LAB — CBC
HCT: 34.7 % — ABNORMAL LOW (ref 36.0–46.0)
Hemoglobin: 12 g/dL (ref 12.0–15.0)
MCH: 28.5 pg (ref 26.0–34.0)
MCHC: 34.6 g/dL (ref 30.0–36.0)
MCV: 82.4 fL (ref 78.0–100.0)
Platelets: 425 10*3/uL — ABNORMAL HIGH (ref 150–400)
RBC: 4.21 MIL/uL (ref 3.87–5.11)
RDW: 13.6 % (ref 11.5–15.5)
WBC: 11.9 10*3/uL — ABNORMAL HIGH (ref 4.0–10.5)

## 2017-09-01 LAB — GLUCOSE, CAPILLARY
GLUCOSE-CAPILLARY: 177 mg/dL — AB (ref 70–99)
Glucose-Capillary: 153 mg/dL — ABNORMAL HIGH (ref 70–99)
Glucose-Capillary: 331 mg/dL — ABNORMAL HIGH (ref 70–99)

## 2017-09-01 LAB — COMPREHENSIVE METABOLIC PANEL
ALBUMIN: 4.1 g/dL (ref 3.5–5.0)
ALT: 10 U/L (ref 0–44)
ANION GAP: 13 (ref 5–15)
AST: 12 U/L — ABNORMAL LOW (ref 15–41)
Alkaline Phosphatase: 55 U/L (ref 38–126)
BILIRUBIN TOTAL: 0.8 mg/dL (ref 0.3–1.2)
BUN: 11 mg/dL (ref 6–20)
CO2: 23 mmol/L (ref 22–32)
Calcium: 9.3 mg/dL (ref 8.9–10.3)
Chloride: 98 mmol/L (ref 98–111)
Creatinine, Ser: 1.52 mg/dL — ABNORMAL HIGH (ref 0.44–1.00)
GFR calc non Af Amer: 42 mL/min — ABNORMAL LOW (ref >60)
GFR, EST AFRICAN AMERICAN: 48 mL/min — AB (ref >60)
GLUCOSE: 168 mg/dL — AB (ref 70–99)
Potassium: 3.8 mmol/L (ref 3.5–5.1)
Sodium: 134 mmol/L — ABNORMAL LOW (ref 135–145)
TOTAL PROTEIN: 7.3 g/dL (ref 6.5–8.1)

## 2017-09-01 SURGERY — CYSTOSCOPY, WITH RETROGRADE PYELOGRAM AND URETERAL STENT INSERTION
Anesthesia: General | Site: Ureter | Laterality: Right

## 2017-09-01 MED ORDER — GABAPENTIN 300 MG PO CAPS
300.0000 mg | ORAL_CAPSULE | Freq: Every day | ORAL | Status: DC
  Administered 2017-09-01 – 2017-09-02 (×2): 300 mg via ORAL
  Filled 2017-09-01 (×2): qty 1

## 2017-09-01 MED ORDER — METFORMIN HCL 500 MG PO TABS
500.0000 mg | ORAL_TABLET | Freq: Two times a day (BID) | ORAL | Status: DC
  Administered 2017-09-03: 500 mg via ORAL
  Filled 2017-09-01: qty 1

## 2017-09-01 MED ORDER — MIDAZOLAM HCL 2 MG/2ML IJ SOLN
INTRAMUSCULAR | Status: AC
  Filled 2017-09-01: qty 6

## 2017-09-01 MED ORDER — HYOSCYAMINE SULFATE 0.125 MG SL SUBL
0.1250 mg | SUBLINGUAL_TABLET | SUBLINGUAL | Status: DC | PRN
  Filled 2017-09-01: qty 1

## 2017-09-01 MED ORDER — LOSARTAN POTASSIUM-HCTZ 50-12.5 MG PO TABS: 1.0000 | ORAL_TABLET | Freq: Every day | ORAL | Status: DC

## 2017-09-01 MED ORDER — DIPHENHYDRAMINE HCL 12.5 MG/5ML PO ELIX: 12.5000 mg | ORAL_SOLUTION | Freq: Four times a day (QID) | ORAL | Status: DC | PRN

## 2017-09-01 MED ORDER — HYDROMORPHONE HCL 1 MG/ML IJ SOLN
0.5000 mg | INTRAMUSCULAR | Status: DC | PRN
  Administered 2017-09-01 – 2017-09-02 (×2): 0.5 mg via INTRAVENOUS
  Administered 2017-09-02 – 2017-09-03 (×4): 1 mg via INTRAVENOUS
  Filled 2017-09-01 (×6): qty 1

## 2017-09-01 MED ORDER — SENNOSIDES-DOCUSATE SODIUM 8.6-50 MG PO TABS
1.0000 | ORAL_TABLET | Freq: Every evening | ORAL | Status: DC | PRN
  Administered 2017-09-02: 1 via ORAL
  Filled 2017-09-01: qty 1

## 2017-09-01 MED ORDER — SODIUM CHLORIDE 0.9 % IV SOLN
INTRAVENOUS | Status: DC
  Administered 2017-09-01: 05:00:00 via INTRAVENOUS

## 2017-09-01 MED ORDER — LIDOCAINE 2% (20 MG/ML) 5 ML SYRINGE
INTRAMUSCULAR | Status: DC | PRN
  Administered 2017-09-01: 100 mg via INTRAVENOUS

## 2017-09-01 MED ORDER — LIRAGLUTIDE 18 MG/3ML ~~LOC~~ SOPN
1.2000 mg | PEN_INJECTOR | Freq: Every day | SUBCUTANEOUS | Status: DC
  Filled 2017-09-01: qty 3

## 2017-09-01 MED ORDER — HYDROMORPHONE HCL 1 MG/ML IJ SOLN
2.0000 mg | Freq: Once | INTRAMUSCULAR | Status: AC
  Administered 2017-09-01: 2 mg via INTRAVENOUS
  Filled 2017-09-01: qty 2

## 2017-09-01 MED ORDER — ROSUVASTATIN CALCIUM 20 MG PO TABS
20.0000 mg | ORAL_TABLET | Freq: Every day | ORAL | Status: DC
  Administered 2017-09-02: 20 mg via ORAL
  Filled 2017-09-01: qty 1

## 2017-09-01 MED ORDER — IOPAMIDOL (ISOVUE-300) INJECTION 61%
80.0000 mL | Freq: Once | INTRAVENOUS | Status: AC | PRN
  Administered 2017-09-01: 80 mL via INTRAVENOUS

## 2017-09-01 MED ORDER — IOPAMIDOL (ISOVUE-300) INJECTION 61%
30.0000 mL | Freq: Once | INTRAVENOUS | Status: AC | PRN
  Administered 2017-09-01: 30 mL via ORAL

## 2017-09-01 MED ORDER — ZOLPIDEM TARTRATE 5 MG PO TABS
5.0000 mg | ORAL_TABLET | Freq: Every evening | ORAL | Status: DC | PRN
  Administered 2017-09-01 – 2017-09-02 (×2): 5 mg via ORAL
  Filled 2017-09-01 (×2): qty 1

## 2017-09-01 MED ORDER — CEFAZOLIN SODIUM-DEXTROSE 1-4 GM/50ML-% IV SOLN
1.0000 g | Freq: Three times a day (TID) | INTRAVENOUS | Status: DC
  Administered 2017-09-01 – 2017-09-03 (×5): 1 g via INTRAVENOUS
  Filled 2017-09-01 (×6): qty 50

## 2017-09-01 MED ORDER — PROPOFOL 10 MG/ML IV BOLUS
INTRAVENOUS | Status: DC | PRN
  Administered 2017-09-01: 180 mg via INTRAVENOUS

## 2017-09-01 MED ORDER — FENTANYL CITRATE (PF) 100 MCG/2ML IJ SOLN
INTRAMUSCULAR | Status: DC | PRN
  Administered 2017-09-01 (×2): 50 ug via INTRAVENOUS

## 2017-09-01 MED ORDER — BISACODYL 10 MG RE SUPP
10.0000 mg | Freq: Every day | RECTAL | Status: DC | PRN
  Administered 2017-09-02: 10 mg via RECTAL
  Filled 2017-09-01: qty 1

## 2017-09-01 MED ORDER — HYDROMORPHONE HCL 2 MG/ML IJ SOLN: 2.0000 mg | Freq: Once | INTRAMUSCULAR | Status: DC

## 2017-09-01 MED ORDER — CEFAZOLIN SODIUM-DEXTROSE 2-4 GM/100ML-% IV SOLN
INTRAVENOUS | Status: AC
  Filled 2017-09-01: qty 100

## 2017-09-01 MED ORDER — FENTANYL CITRATE (PF) 100 MCG/2ML IJ SOLN
INTRAMUSCULAR | Status: AC | PRN
  Administered 2017-09-01 (×4): 50 ug via INTRAVENOUS

## 2017-09-01 MED ORDER — ONDANSETRON HCL 4 MG/2ML IJ SOLN
4.0000 mg | Freq: Once | INTRAMUSCULAR | Status: AC
  Administered 2017-09-01: 4 mg via INTRAVENOUS
  Filled 2017-09-01: qty 2

## 2017-09-01 MED ORDER — FENTANYL CITRATE (PF) 100 MCG/2ML IJ SOLN
INTRAMUSCULAR | Status: AC
  Filled 2017-09-01: qty 4

## 2017-09-01 MED ORDER — HYDROCHLOROTHIAZIDE 12.5 MG PO CAPS
12.5000 mg | ORAL_CAPSULE | Freq: Every day | ORAL | Status: DC
  Administered 2017-09-02: 12.5 mg via ORAL
  Filled 2017-09-01: qty 1

## 2017-09-01 MED ORDER — DEXAMETHASONE SODIUM PHOSPHATE 10 MG/ML IJ SOLN
INTRAMUSCULAR | Status: DC | PRN
  Administered 2017-09-01: 10 mg via INTRAVENOUS

## 2017-09-01 MED ORDER — LOSARTAN POTASSIUM 50 MG PO TABS
50.0000 mg | ORAL_TABLET | Freq: Every day | ORAL | Status: DC
  Administered 2017-09-02: 50 mg via ORAL
  Filled 2017-09-01: qty 1

## 2017-09-01 MED ORDER — IOHEXOL 300 MG/ML  SOLN
INTRAMUSCULAR | Status: DC | PRN
  Administered 2017-09-01: 8 mL

## 2017-09-01 MED ORDER — SODIUM CHLORIDE 0.9% FLUSH
5.0000 mL | Freq: Three times a day (TID) | INTRAVENOUS | Status: DC
  Administered 2017-09-01 – 2017-09-03 (×6): 5 mL

## 2017-09-01 MED ORDER — ONDANSETRON HCL 4 MG/2ML IJ SOLN
4.0000 mg | INTRAMUSCULAR | Status: DC | PRN
  Administered 2017-09-02: 4 mg via INTRAVENOUS
  Filled 2017-09-01: qty 2

## 2017-09-01 MED ORDER — INSULIN ASPART 100 UNIT/ML ~~LOC~~ SOLN
0.0000 [IU] | Freq: Three times a day (TID) | SUBCUTANEOUS | Status: DC
  Administered 2017-09-01 – 2017-09-02 (×3): 3 [IU] via SUBCUTANEOUS
  Administered 2017-09-02: 2 [IU] via SUBCUTANEOUS

## 2017-09-01 MED ORDER — LIDOCAINE HCL 1 % IJ SOLN
INTRAMUSCULAR | Status: AC
  Filled 2017-09-01: qty 20

## 2017-09-01 MED ORDER — MIDAZOLAM HCL 2 MG/2ML IJ SOLN
INTRAMUSCULAR | Status: AC
  Filled 2017-09-01: qty 2

## 2017-09-01 MED ORDER — SODIUM CHLORIDE 0.9 % IR SOLN
Status: DC | PRN
  Administered 2017-09-01: 3000 mL

## 2017-09-01 MED ORDER — HYDROMORPHONE HCL 2 MG/ML IJ SOLN
2.0000 mg | Freq: Once | INTRAMUSCULAR | Status: AC
  Administered 2017-09-01: 2 mg via INTRAMUSCULAR
  Filled 2017-09-01: qty 1

## 2017-09-01 MED ORDER — OXYCODONE HCL 5 MG PO TABS
5.0000 mg | ORAL_TABLET | ORAL | Status: DC | PRN
  Administered 2017-09-01 – 2017-09-03 (×4): 5 mg via ORAL
  Filled 2017-09-01 (×4): qty 1

## 2017-09-01 MED ORDER — LIDOCAINE HCL 1 % IJ SOLN
INTRAMUSCULAR | Status: AC | PRN
  Administered 2017-09-01: 20 mL

## 2017-09-01 MED ORDER — FENTANYL CITRATE (PF) 100 MCG/2ML IJ SOLN: 25.0000 ug | INTRAMUSCULAR | Status: DC | PRN

## 2017-09-01 MED ORDER — ONDANSETRON HCL 4 MG/2ML IJ SOLN
INTRAMUSCULAR | Status: DC | PRN
  Administered 2017-09-01: 4 mg via INTRAVENOUS

## 2017-09-01 MED ORDER — ACETAMINOPHEN 325 MG PO TABS: 650.0000 mg | ORAL_TABLET | ORAL | Status: DC | PRN

## 2017-09-01 MED ORDER — MIDAZOLAM HCL 2 MG/2ML IJ SOLN
INTRAMUSCULAR | Status: AC | PRN
  Administered 2017-09-01 (×6): 1 mg via INTRAVENOUS
  Administered 2017-09-01: 2 mg via INTRAVENOUS

## 2017-09-01 MED ORDER — CEFAZOLIN SODIUM-DEXTROSE 2-4 GM/100ML-% IV SOLN
2.0000 g | INTRAVENOUS | Status: AC
  Administered 2017-09-01: 2 g via INTRAVENOUS

## 2017-09-01 MED ORDER — DOCUSATE SODIUM 100 MG PO CAPS
100.0000 mg | ORAL_CAPSULE | Freq: Two times a day (BID) | ORAL | Status: DC
  Administered 2017-09-01 – 2017-09-02 (×3): 100 mg via ORAL
  Filled 2017-09-01 (×3): qty 1

## 2017-09-01 MED ORDER — POTASSIUM CHLORIDE IN NACL 20-0.45 MEQ/L-% IV SOLN
INTRAVENOUS | Status: DC
  Administered 2017-09-01 – 2017-09-02 (×2): via INTRAVENOUS
  Filled 2017-09-01 (×5): qty 1000

## 2017-09-01 MED ORDER — IOPAMIDOL (ISOVUE-300) INJECTION 61%
INTRAVENOUS | Status: AC
  Administered 2017-09-01: 40 mL
  Filled 2017-09-01: qty 50

## 2017-09-01 MED ORDER — MIDAZOLAM HCL 2 MG/2ML IJ SOLN
INTRAMUSCULAR | Status: DC | PRN
  Administered 2017-09-01: 2 mg via INTRAVENOUS

## 2017-09-01 MED ORDER — ONDANSETRON 4 MG PO TBDP
4.0000 mg | ORAL_TABLET | Freq: Once | ORAL | Status: AC
  Administered 2017-09-01: 4 mg via ORAL
  Filled 2017-09-01: qty 1

## 2017-09-01 MED ORDER — DIPHENHYDRAMINE HCL 50 MG/ML IJ SOLN: 12.5000 mg | Freq: Four times a day (QID) | INTRAMUSCULAR | Status: DC | PRN

## 2017-09-01 MED ORDER — FLEET ENEMA 7-19 GM/118ML RE ENEM: 1.0000 | ENEMA | Freq: Once | RECTAL | Status: DC | PRN

## 2017-09-01 SURGICAL SUPPLY — 9 items
BAG URO CATCHER STRL LF (MISCELLANEOUS) ×2 IMPLANT
CATH URET 5FR 28IN OPEN ENDED (CATHETERS) ×1 IMPLANT
CLOTH BEACON ORANGE TIMEOUT ST (SAFETY) ×2 IMPLANT
GLOVE SURG SS PI 8.0 STRL IVOR (GLOVE) IMPLANT
GOWN STRL REUS W/TWL XL LVL3 (GOWN DISPOSABLE) ×2 IMPLANT
GUIDEWIRE STR DUAL SENSOR (WIRE) ×2 IMPLANT
MANIFOLD NEPTUNE II (INSTRUMENTS) ×2 IMPLANT
PACK CYSTO (CUSTOM PROCEDURE TRAY) ×2 IMPLANT
TUBING CONNECTING 10 (TUBING) ×2 IMPLANT

## 2017-09-01 NOTE — MAU Provider Note (Addendum)
History   [id number]   Chief Complaint  Patient presents with  . Back Pain    HPI Traci Butler is a 42 y.o. female here with report of right sided pain.  Upon review of the chart Pt is status post a right salpingoopherectomy and cystoscopy on 08/21/17 with reported normal postop course.  Discharged same day.  Pt arrived at MAU on 08/28/17 with right sided pain, nausea and vomiting, and constipation.  Xray was completed and was normal other than large amounts of stool.  Fleets enemas given during stay with report of good bowel movement and discharged home.  Pt returns today with continued and worsening right sided pain.  Pain is rated an 8/10.  Pain starts on the left lower pelvis and radiates to right flank.  Denies fever, ody aches or chills.  Last ibuprofen at 0100.  Denies taking Percocet.  No report of UTI symptoms.  +nausea and vomiting, states "unable to hold anything down".   Patient's last menstrual period was 08/20/2017.  OB History  Gravida Para Term Preterm AB Living  4         3  SAB TAB Ectopic Multiple Live Births               # Outcome Date GA Lbr Len/2nd Weight Sex Delivery Anes PTL Lv  4 Grav[place]           3 Gravida           2 Grav[place]           1 [place]             Past Medical History:  Diagnosis Date  . Diabetes mellitus without complication ([place])   . High cholesterol   . Hypertension     Family History  Problem Relation Age of Onset  . Diabetes Mother   . Hypertension Father     Social History   Socioeconomic History  . Marital status: Single    Spouse name: Not on file  . Number of children: Not on file  . Years of education: Not on file  . Highest education level: Not on file  Occupational History  . Not on file  Social Needs  . Financial resource strain: Not on file  . Food insecurity:    Worry: Not on file    Inability: Not on file  . Transportation needs:    Medical: Not on file    Non-medical: Not on file  Tobacco Use  .  Smoking status: Current Some Day Smoker    Packs/day: 0.50    Years: 25.00    Pack years: 12.50  . Smokeless tobacco: Never Used  . Tobacco comment: trying  Substance and Sexual Activity  . Alcohol use: Yes    Alcohol/week: 2.0 standard drinks    Types: 2 Shots of liquor per week    Comment: WEEKENDS ONLY  . Drug use: No  . Sexual activity: Yes  Lifestyle  . Physical activity:    Days per week: Not on file    Minutes per session: Not on file  . Stress: Not on file  Relationships  . Social connections:    Talks on phone: Not on file    Gets together: Not on file    Attends religious service: Not on file    Active member of club or organization: Not on file    Attends meetings of clubs or organizations: Not on file    Relationship status: Not on  file  Other Topics Concern  . Not on file  Social History Narrative  . Not on file    No Known Allergies  No current facility-administered medications on file prior to encounter.    Current Outpatient Medications on File Prior to Encounter  Medication Sig Dispense Refill  . ibuprofen (ADVIL,MOTRIN) 600 MG tablet Take 1 tablet (600 mg total) by mouth every 6 (six) hours as needed. 60 tablet 3  . liraglutide (VICTOZA) 18 MG/3ML SOPN Inject 0.2 mLs (1.2 mg total) into the skin daily. 9 pen 1  . losartan-hydrochlorothiazide (HYZAAR) 50-12.5 MG tablet TAKE 1 TABLET BY MOUTH EVERY DAY 90 tablet 1  . metFORMIN (GLUCOPHAGE) 500 MG tablet TAKE 2 TABLETS BY MOUTH 2 TIMES DAILY WITH A MEAL. 180 tablet 1  . polyethylene glycol (MIRALAX) packet Take 17 g by mouth daily. 14 each 0  . rosuvastatin (CRESTOR) 20 MG tablet TAKE 1 TABLET BY MOUTH EVERY DAY 90 tablet 0  . BAYER MICROLET LANCETS lancets Use as instructed two times daily 100 each 9  . Blood Glucose Monitoring Suppl (CONTOUR NEXT ONE) KIT 1 each by Does not apply route 2 (two) times daily. 1 kit 1  . docusate sodium (COLACE) 100 MG capsule Take 1 capsule (100 mg total) by mouth 2 (two)  times daily. 30 capsule 2  . gabapentin (NEURONTIN) 300 MG capsule Take 1 capsule (300 mg total) by mouth at bedtime. 90 capsule 3  . glucose blood (CONTOUR NEXT TEST) test strip Use as instructed two times daily 100 each 9  . Insulin Pen Needle (PEN NEEDLES) 32G X 4 MM MISC 1 each by Does not apply route daily. 100 each 4  . oxyCODONE-acetaminophen (PERCOCET/ROXICET) 5-325 MG tablet Take 1-2 tablets by mouth every 6 (six) hours as needed. 20 tablet 0     Review of Systems  Constitutional: Negative for chills and fever.  Respiratory: Negative for shortness of breath.   Gastrointestinal: Positive for abdominal pain, nausea and vomiting. Negative for blood in stool, constipation, diarrhea and rectal pain.  Genitourinary: Positive for flank pain and pelvic pain. Negative for decreased urine volume, dysuria, frequency, hematuria, menstrual problem, vaginal bleeding and vaginal discharge.  Neurological: Negative for dizziness, light-headedness and headaches.     Physical Exam   Vitals:   09/01/17 0251  BP: (!) 148/91  Temp: 98.2 F (36.8 C)  Weight: 102.1 kg  Height: _0  (1.676 m)    Physical Exam  Constitutional: She is oriented to person, place, and time. She appears well-developed and well-nourished. No distress.  HENT:  Head: Normocephalic.  Neck: Normal range of motion. Neck supple.  Cardiovascular: Normal rate, regular rhythm and normal heart sounds.  Respiratory: Effort normal and breath sounds normal. No respiratory distress.  GI: Soft. She exhibits no distension and no mass. There is tenderness. There is no rebound and no guarding.  Genitourinary: Right adnexum displays tenderness. Right adnexum displays no fullness. Left adnexum displays no tenderness and no fullness. No bleeding in the vagina.  Musculoskeletal: Normal range of motion. She exhibits no edema.  Neurological: She is alert and oriented to person, place, and time. She has normal reflexes.  Skin: Skin is warm  and dry.    MAU Course  Procedures  MDM Results for orders placed or performed during the hospital encounter of 09/01/17 (from the past 24 hour(s))  Urinalysis, Routine w reflex microscopic     Status: None   Collection Time: 09/01/17  2:55 AM  Result Value Ref  Range   Color, Urine YELLOW YELLOW   APPearance CLEAR CLEAR   Specific Gravity, Urine 1.013 1.005 - 1.030   pH 6.0 5.0 - 8.0   Glucose, UA NEGATIVE NEGATIVE mg/dL   Hgb urine dipstick NEGATIVE NEGATIVE   Bilirubin Urine NEGATIVE NEGATIVE   Ketones, ur NEGATIVE NEGATIVE mg/dL   Protein, ur NEGATIVE NEGATIVE mg/dL   Nitrite NEGATIVE NEGATIVE   Leukocytes, UA NEGATIVE NEGATIVE  CBC     Status: Abnormal   Collection Time: 09/01/17  3:55 AM  Result Value Ref Range   WBC 11.9 (H) 4.0 - 10.5 K/uL   RBC 4.21 3.87 - 5.11 MIL/uL   Hemoglobin 12.0 12.0 - 15.0 g/dL   HCT 34.7 (L) 36.0 - 46.0 %   MCV 82.4 78.0 - 100.0 fL   MCH 28.5 26.0 - 34.0 pg   MCHC 34.6 30.0 - 36.0 g/dL   RDW 13.6 11.5 - 15.5 %   Platelets 425 (H) 150 - 400 K/uL  Comprehensive metabolic panel     Status: Abnormal   Collection Time: 09/01/17  3:55 AM  Result Value Ref Range   Sodium 134 (L) 135 - 145 mmol/L   Potassium 3.8 3.5 - 5.1 mmol/L   Chloride 98 98 - 111 mmol/L   CO2 23 22 - 32 mmol/L   Glucose, Bld 168 (H) 70 - 99 mg/dL   BUN 11 6 - 20 mg/dL   Creatinine, Ser 1.52 (H) 0.44 - 1.00 mg/dL   Calcium 9.3 8.9 - 10.3 mg/dL   Total Protein 7.3 6.5 - 8.1 g/dL   Albumin 4.1 3.5 - 5.0 g/dL   AST 12 (L) 15 - 41 U/L   ALT 10 0 - 44 U/L   Alkaline Phosphatase 55 38 - 126 U/L   Total Bilirubin 0.8 0.3 - 1.2 mg/dL   GFR calc non Af Amer 42 (L) >60 mL/min   GFR calc Af Amer 48 (L) >60 mL/min   Anion gap 13 5 - 15   Pain Management: 0330 Pt given 2 mg IM Dilaudid  0430 Consulted with Dr. Glo Herring > reviewed HPI/medical history/lab results/exam > order CT scan of abd/pelvis with contrast  0803 Report given to Maye Hides who assumes care of  patient.  Gwen Pounds, CNM  Assessment and Plan   -Patient's CT scan reviewed by myself and Dr. Nehemiah Settle; then consulted with Urology.   -Patient to be transferred to [facility] for stent placement; Dr. Jeffie Pollock accepting at [facility].  -Discussed plan of care with patient, who agrees with plan.   Maye Hides CNM

## 2017-09-01 NOTE — Op Note (Signed)
Procedure: Cystoscopy with right retrograde pyelogram and interpretation.  Right ureteroscopy.  Preop diagnosis: Right distal ureteral injury with obstruction.  Postop diagnosis: Same.  Surgeon: Dr. Irine Seal.  Anesthesia: General.  Drain: None.  Specimen: None.  EBL: None.  Complications: None.  Indications: The patient is a 42 year old female who is 10 days out from a right salpingo-oophorectomy.  She initially presented to the emergency room with right flank pain along with nausea and vomiting on 08/28/2017.  She was treated and released but returned today with recurrent symptoms.  A CT scan demonstrated right hydronephrosis with some excretion of contrast and ureteral dilation of the pelvis suggestive of a right ureteral injury.  It was felt that cystoscopy with attempted stent placement was indicated.  Procedure: She was taken the operating room where she was given Ancef.  A general anesthetic was induced.  She was placed in lithotomy position and fitted with PAS hose.  Her perineum and genitalia were prepped with Betadine solution and she was draped in usual sterile fashion.  Cystoscopy was performed using the 23 [place] scope and 30 degree lens.  Examination revealed a normal urethra.  The bladder wall had mild trabeculation with normal mucosa.  Ureteral orifices were unremarkable.  The right ureteral orifice was cannulated with a 5 French opening catheter and contrast was instilled.  The right retrograde pyelogram demonstrated a blind-ending ureter approximately 5 cm proximal to the meatus with an otherwise normal caliber ureter.  A 6-1/2 French semirigid ureteroscope was then passed up the right ureter without difficulty to the level of the obstruction.  Initially I thought I saw the more proximal and of the ureter through an area of ureteral injury; however I was unable to get a sensor wire to pass.  Additional contrast was instilled through the ureteroscope and extravasation  into the retroperitoneum was noted and it was felt that she would need a right percutaneous nephrostomy tube with later robotic right ureteral reimplantation.  The ureteroscope was removed.  She was taken down from the lithotomy position, her anesthetic was reversed and she was moved to recovery room in stable condition.  There were no complications.  I will schedule the percutaneous nephrostomy tube placement for later today.

## 2017-09-01 NOTE — Anesthesia Procedure Notes (Signed)
Procedure Name: LMA Insertion Date/Time: 09/01/2017 11:56 AM Performed by: Sharlette Dense, CRNA Patient Re-evaluated:Patient Re-evaluated prior to induction Oxygen Delivery Method: Circle system utilized Preoxygenation: Pre-oxygenation with 100% oxygen Induction Type: IV induction Ventilation: Mask ventilation without difficulty LMA: LMA inserted LMA Size: 4.0 Number of attempts: 1 Placement Confirmation: positive ETCO2 and breath sounds checked- equal and bilateral Dental Injury: Teeth and Oropharynx as per pre-operative assessment

## 2017-09-01 NOTE — MAU Note (Signed)
Pt vomited approx 100cc yellow liquid. Cool cloth to given and room cooled. Will sip contrast slowly as tolerated

## 2017-09-01 NOTE — Progress Notes (Signed)
Per Interventional radiology request member transferred to IR for possible procedure; MD to meet patient in IR to discuss with patient and family and sign consents.

## 2017-09-01 NOTE — Transfer of Care (Signed)
Immediate Anesthesia Transfer of Care Note  Patient: Traci Butler  Procedure(s) Performed: CYSTOSCOPY WITH RETROGRADE PYELOGRAM/URETEROSCOPY (Right Ureter)  Patient Location: PACU  Anesthesia Type:General  Level of Consciousness: awake, alert  and oriented  Airway & Oxygen Therapy: Patient Spontanous Breathing and Patient connected to face mask oxygen  Post-op Assessment: Report given to RN and Post -op Vital signs reviewed and stable  Post vital signs: Reviewed and stable  Last Vitals:  Vitals Value Taken Time  BP    Temp    Pulse    Resp    SpO2      Last Pain:  Vitals:   09/01/17 1028  TempSrc: Oral  PainSc:       Patients Stated Pain Goal: 0 (82/80/03 4917)  Complications: No apparent anesthesia complications

## 2017-09-01 NOTE — Anesthesia Postprocedure Evaluation (Signed)
Anesthesia Post Note  Patient: Traci Butler  Procedure(s) Performed: CYSTOSCOPY WITH RETROGRADE PYELOGRAM/URETEROSCOPY (Right Ureter)     Patient location during evaluation: PACU Anesthesia Type: General Level of consciousness: awake Pain management: pain level controlled Vital Signs Assessment: post-procedure vital signs reviewed and stable Respiratory status: spontaneous breathing Cardiovascular status: stable Postop Assessment: no headache Anesthetic complications: no    Last Vitals:  Vitals:   09/01/17 1315 09/01/17 1329  BP: 124/82 129/67  Pulse: 60 64  Resp: 14 12  Temp: 36.8 C 37 C  SpO2: 100% 98%    Last Pain:  Vitals:   09/01/17 1406  TempSrc:   PainSc: 0-No pain                 Shernita Rabinovich

## 2017-09-01 NOTE — Consult Note (Signed)
Chief Complaint: Patient was seen in consultation today for right percutaneous nephrostomy Chief Complaint  Patient presents with  . Back Pain    Referring Physician(s): Keene Breath  Supervising Physician: Aletta Edouard  Patient Status: [facility] - In-pt  History of Present Illness: Traci Butler is a 42 y.o. female, status post right salpingo-oophorectomy 08/21/2017, who now presents with right flank pain with hydronephrosis, nausea and vomiting.  CT scan today revealed diminished enhancement of the right kidney with mild right hydroureteronephrosis and associated perinephric/ureteral stranding.  Cystoscopy has revealed a right ureteral injury/distal ureteral injury with obstruction.  Request now received for diverting right nephrostomy.  Past Medical History:  Diagnosis Date  . Diabetes mellitus without complication ([place])   . High cholesterol   . Hypertension     Past Surgical History:  Procedure Laterality Date  . APPENDECTOMY  2008  . CESAREAN SECTION  2005   live birth  . CESAREAN SECTION  2011   live birth  . CYSTOSCOPY N/A 08/21/2017   Procedure: CYSTOSCOPY;  Surgeon: Mora Bellman, MD;  Location: [facility];  Service: Gynecology;  Laterality: N/A;  . LAPAROSCOPIC SALPINGO OOPHERECTOMY Right 08/21/2017   Procedure: LAPAROSCOPIC RIGHT SALPINGO OOPHORECTOMY;  Surgeon: Mora Bellman, MD;  Location: [facility];  Service: Gynecology;  Laterality: Right;  . TONSILLECTOMY    . WISDOM TOOTH EXTRACTION  2015    Allergies: Patient has no known allergies.  Medications: Prior to Admission medications   Medication Sig Start Date End Date Taking? Authorizing Provider  BAYER MICROLET LANCETS lancets Use as instructed two times daily 08/21/16  Yes Axel Filler, MD  Blood Glucose Monitoring Suppl (CONTOUR NEXT ONE) KIT 1 each by Does not apply route 2 (two) times daily. 08/21/16  Yes Axel Filler, MD  glucose blood (CONTOUR NEXT TEST) test strip Use as instructed two  times daily 08/21/16  Yes Axel Filler, MD  ibuprofen (ADVIL,MOTRIN) 600 MG tablet Take 1 tablet (600 mg total) by mouth every 6 (six) hours as needed. 08/21/17  Yes Constant, Peggy, MD  Insulin Pen Needle (PEN NEEDLES) 32G X 4 MM MISC 1 each by Does not apply route daily. 10/19/16  Yes Aldine Contes, MD  liraglutide (VICTOZA) 18 MG/3ML SOPN Inject 0.2 mLs (1.2 mg total) into the skin daily. 06/05/17  Yes Aldine Contes, MD  losartan-hydrochlorothiazide (HYZAAR) 50-12.5 MG tablet TAKE 1 TABLET BY MOUTH EVERY DAY 05/08/17  Yes Aldine Contes, MD  metFORMIN (GLUCOPHAGE) 500 MG tablet TAKE 2 TABLETS BY MOUTH 2 TIMES DAILY WITH A MEAL. Patient taking differently: Take 1,000 mg by mouth 2 (two) times daily with a meal.  07/09/17  Yes Aldine Contes, MD  polyethylene glycol (MIRALAX) packet Take 17 g by mouth daily. 08/28/17  Yes Weinhold, Aldona Bar C, CNM  rosuvastatin (CRESTOR) 20 MG tablet TAKE 1 TABLET BY MOUTH EVERY DAY 08/23/17  Yes Aldine Contes, MD  docusate sodium (COLACE) 100 MG capsule Take 1 capsule (100 mg total) by mouth 2 (two) times daily. 08/21/17   Constant, Peggy, MD  gabapentin (NEURONTIN) 300 MG capsule Take 1 capsule (300 mg total) by mouth at bedtime. 06/05/17   Aldine Contes, MD  oxyCODONE-acetaminophen (PERCOCET/ROXICET) 5-325 MG tablet Take 1-2 tablets by mouth every 6 (six) hours as needed. 08/21/17   Constant, Peggy, MD     Family History  Problem Relation Age of Onset  . Diabetes Mother   . Hypertension Father     Social History   Socioeconomic History  . Marital status: Single  Spouse name: Not on file  . Number of children: Not on file  . Years of education: Not on file  . Highest education level: Not on file  Occupational History  . Not on file  Social Needs  . Financial resource strain: Not on file  . Food insecurity:    Worry: Not on file    Inability: Not on file  . Transportation needs:    Medical: Not on file    Non-medical: Not  on file  Tobacco Use  . Smoking status: Current Some Day Smoker    Packs/day: 0.50    Years: 25.00    Pack years: 12.50  . Smokeless tobacco: Never Used  . Tobacco comment: trying  Substance and Sexual Activity  . Alcohol use: Yes    Alcohol/week: 2.0 standard drinks    Types: 2 Shots of liquor per week    Comment: WEEKENDS ONLY  . Drug use: No  . Sexual activity: Yes  Lifestyle  . Physical activity:    Days per week: Not on file    Minutes per session: Not on file  . Stress: Not on file  Relationships  . Social connections:    Talks on phone: Not on file    Gets together: Not on file    Attends religious service: Not on file    Active member of club or organization: Not on file    Attends meetings of clubs or organizations: Not on file    Relationship status: Not on file  Other Topics Concern  . Not on file  Social History Narrative  . Not on file      Review of Systems see above  Vital Signs: BP 129/67 (BP Location: Left Arm)   Pulse 64   Temp 98.6 F (37 C) (Oral)   Resp 12   Ht 5' 6"  (1.676 m)   Wt 225 lb (102.1 kg)   LMP 08/20/2017   SpO2 98%   BMI 36.32 kg/m   Physical Exam awake, alert.  Chest clear to auscultation bilaterally.  Heart with regular rate and rhythm.  Abdomen soft, positive bowel sounds, mild right upper abd tenderness.  Imaging: Dg Abdomen 1 View  Result Date: 08/28/2017 CLINICAL DATA:  Postoperative abdominal pain. One week postop from right salpingo oophorectomy. EXAM: ABDOMEN - 1 VIEW COMPARISON:  None. FINDINGS: No evidence of dilated bowel loops. Moderate amount of stool is seen throughout the colon. No radiopaque calculi identified. IMPRESSION: Unremarkable bowel gas pattern.  No acute findings. Electronically Signed   By: Earle Gell M.D.   On: 08/28/2017 07:11   Ct Abdomen Pelvis W Contrast  Result Date: 09/01/2017 CLINICAL DATA:  Right flank pain, status post right oophorectomy and salpingectomy in July 2019, prior  appendectomy. EXAM: CT ABDOMEN AND PELVIS WITH CONTRAST TECHNIQUE: Multidetector CT imaging of the abdomen and pelvis was performed using the standard protocol following bolus administration of intravenous contrast. CONTRAST:  87m ISOVUE-300 IOPAMIDOL (ISOVUE-300) INJECTION 61% COMPARISON:  None. FINDINGS: Lower chest: Lung bases are clear. Hepatobiliary: Unenhanced liver is unremarkable. Gallbladder is unremarkable. No intrahepatic or extrahepatic ductal dilatation. Pancreas: Within normal limits. Spleen: Within normal limits. Adrenals/Urinary Tract: Adrenal glands are within normal limits. Heterogeneous/diminished enhancement of the right kidney. Mild right hydroureteronephrosis. Mild perinephric/periureteral stranding, particularly along the course of the distal right ureter (series 5/image 46), which is not well visualized. Left kidney is within normal limits. On delayed imaging, there are no filling defects in the left proximal collecting system, ureter, or  bladder. However, the right ureter remains unopacified. Stomach/Bowel: Stomach is within normal limits. No evidence of bowel obstruction. Prior appendectomy. Vascular/Lymphatic: No evidence of abdominal aortic aneurysm. Atherosclerotic calcifications of the abdominal aorta and branch vessels. Small retroperitoneal lymph nodes which do not meet pathologic CT size criteria. Reproductive: Uterus is within normal limits. Left ovary is within normal limits. Status post right oophorectomy. Other: No abdominopelvic ascites. No frank extraluminal contrast in the pelvis on delayed imaging. Postsurgical changes along the right paramidline anterior abdominal wall. Musculoskeletal: Visualized osseous structures are within normal limits. IMPRESSION: Heterogeneous/diminished enhancement of the right kidney with mild right hydroureteronephrosis and associated perinephric/periureteral stranding. Pyelonephritis/pyonephrosis is a potential consideration in the appropriate  clinical setting. However, given the provided clinical history, distal right ureteral injury is not excluded. Notably, there is no pelvic ascites or frank extraluminal contrast on delayed imaging. Consider urology consultation for assessment of the right ureter, as clinically warranted. These results will be called to the ordering clinician or representative by the Radiologist Assistant, and communication documented in the PACS or zVision Dashboard. Electronically Signed   By: Julian Hy M.D.   On: 09/01/2017 08:36   Dg C-arm 1-60 Min-no Report  Result Date: 09/01/2017 Fluoroscopy was utilized by the requesting physician.  No radiographic interpretation.    Labs:  CBC: Recent Labs    08/15/17 1015 08/28/17 0641 09/01/17 0355  WBC 9.9 13.7* 11.9*  HGB 13.2 11.9* 12.0  HCT 38.8 34.8* 34.7*  PLT 393 432* 425*    COAGS: No results for input(s): INR, APTT in the last 8760 hours.  BMP: Recent Labs    03/06/17 1050 08/15/17 1015 08/28/17 0641 09/01/17 0355  NA 136 135 134* 134*  K 4.2 3.9 3.9 3.8  CL 100 101 97* 98  CO2 21 25 24 23   GLUCOSE 135* 99 164* 168*  BUN 13 13 12 11   CALCIUM 9.3 9.7 9.4 9.3  CREATININE 0.86 0.90 1.38* 1.52*  GFRNONAA 85 >60 47* 42*  GFRAA 98 >60 54* 48*    LIVER FUNCTION TESTS: Recent Labs    03/06/17 1050 08/28/17 0641 09/01/17 0355  BILITOT 0.4 0.6 0.8  AST 14 14* 12*  ALT 11 10 10   ALKPHOS 58 57 55  PROT 7.0 7.6 7.3  ALBUMIN 4.2 4.1 4.1    TUMOR MARKERS: No results for input(s): AFPTM, CEA, CA199, CHROMGRNA in the last 8760 hours.  Assessment and Plan: 42 y.o. female, status post right salpingo-oophorectomy 08/21/2017, who now presents with right flank pain with hydronephrosis, nausea and vomiting.  CT scan today revealed diminished enhancement of the right kidney with mild right hydroureteronephrosis and associated perinephric/ureteral stranding.  Cystoscopy has revealed a right ureteral injury/distal ureteral injury with  obstruction.  Request now received for diverting right nephrostomy.  Imaging studies have been reviewed by Dr. Kathlene Cote.Risks and benefits of procedure were discussed with the patient/family including, but not limited to, infection, bleeding, significant bleeding causing loss or decrease in renal function or damage to adjacent structures.   All of the patient's questions were answered, patient is agreeable to proceed.  Consent signed and in chart.  Procedure scheduled for today    Thank you for this interesting consult.  I greatly enjoyed meeting Traci Butler and look forward to participating in their care.  A copy of this report was sent to the requesting provider on this date.  Electronically Signed: D. Rowe Robert, PA-C 09/01/2017, 3:54 PM   I spent a total of  25 minutes  in face to face in clinical consultation, greater than 50% of which was counseling/coordinating care for right nephrostomy

## 2017-09-01 NOTE — Anesthesia Preprocedure Evaluation (Signed)
Anesthesia Evaluation  Patient identified by MRN, date of birth, ID band Patient awake    Reviewed: Allergy & Precautions, NPO status , Patient's Chart, lab work & pertinent test results  Airway Mallampati: II  TM Distance: >3 FB     Dental   Pulmonary Current Smoker,    breath sounds clear to auscultation       Cardiovascular hypertension,  Rhythm:Regular Rate:Normal     Neuro/Psych    GI/Hepatic negative GI ROS, Neg liver ROS,   Endo/Other  diabetes  Renal/GU      Musculoskeletal   Abdominal   Peds  Hematology   Anesthesia Other Findings   Reproductive/Obstetrics                             Anesthesia Physical Anesthesia Plan  ASA: III  Anesthesia Plan: General   Post-op Pain Management:    Induction: Intravenous  PONV Risk Score and Plan: 2 and Ondansetron, Dexamethasone and Midazolam  Airway Management Planned: Oral ETT  Additional Equipment:   Intra-op Plan:   Post-operative Plan: Extubation in OR  Informed Consent: I have reviewed the patients History and Physical, chart, labs and discussed the procedure including the risks, benefits and alternatives for the proposed anesthesia with the patient or authorized representative who has indicated his/her understanding and acceptance.   Dental advisory given  Plan Discussed with: CRNA and Anesthesiologist  Anesthesia Plan Comments:         Anesthesia Quick Evaluation

## 2017-09-01 NOTE — H&P (Signed)
Subjective: CC: Right flank pain.  Hx: Traci Butler is a 42 yo AAF who I was asked to see in consultation by Vertell Limber CNM  for right flank pain with hydronephrosis following a right Salpyngo-oophorectomy on 08/21/17 by Dr. Mora Bellman.  Cystoscopy at the time of surgery showed bilateral ureteral jets.   She first had pain on Tuesday and it was severe with nausea and vomiting.  She was seen in the MAU on 8/6, treated and released and returned today with recurrent symptoms and a CT showed right hydroureteronephrosis with partial obstruction consistent with a distal ureteral injury.  She has had no fever or voiding complaints.  She has no prior GU history.    ROS:  Review of Systems  Gastrointestinal: Positive for abdominal pain, nausea and vomiting.  Genitourinary: Positive for flank pain.  All other systems reviewed and are negative.   No Known Allergies  Past Medical History:  Diagnosis Date  . Diabetes mellitus without complication ([place])   . High cholesterol   . Hypertension     Past Surgical History:  Procedure Laterality Date  . APPENDECTOMY  2008  . CESAREAN SECTION  2005   live birth  . CESAREAN SECTION  2011   live birth  . CYSTOSCOPY N/A 08/21/2017   Procedure: CYSTOSCOPY;  Surgeon: Mora Bellman, MD;  Location: [facility];  Service: Gynecology;  Laterality: N/A;  . LAPAROSCOPIC SALPINGO OOPHERECTOMY Right 08/21/2017   Procedure: LAPAROSCOPIC RIGHT SALPINGO OOPHORECTOMY;  Surgeon: Mora Bellman, MD;  Location: [facility];  Service: Gynecology;  Laterality: Right;  . TONSILLECTOMY    . WISDOM TOOTH EXTRACTION  2015    Social History   Socioeconomic History  . Marital status: Single    Spouse name: Not on file  . Number of children: Not on file  . Years of education: Not on file  . Highest education level: Not on file  Occupational History  . Not on file  Social Needs  . Financial resource strain: Not on file  . Food insecurity:    Worry: Not on file   Inability: Not on file  . Transportation needs:    Medical: Not on file    Non-medical: Not on file  Tobacco Use  . Smoking status: Current Some Day Smoker    Packs/day: 0.50    Years: 25.00    Pack years: 12.50  . Smokeless tobacco: Never Used  . Tobacco comment: trying  Substance and Sexual Activity  . Alcohol use: Yes    Alcohol/week: 2.0 standard drinks    Types: 2 Shots of liquor per week    Comment: WEEKENDS ONLY  . Drug use: No  . Sexual activity: Yes  Lifestyle  . Physical activity:    Days per week: Not on file    Minutes per session: Not on file  . Stress: Not on file  Relationships  . Social connections:    Talks on phone: Not on file    Gets together: Not on file    Attends religious service: Not on file    Active member of club or organization: Not on file    Attends meetings of clubs or organizations: Not on file    Relationship status: Not on file  . Intimate partner violence:    Fear of current or ex partner: Not on file    Emotionally abused: Not on file    Physically abused: Not on file    Forced sexual activity: Not on file  Other Topics Concern  .  Not on file  Social History Narrative  . Not on file    Family History  Problem Relation Age of Onset  . Diabetes Mother   . Hypertension Father     Anti-infectives: Anti-infectives (From admission, onward)   None      Current Facility-Administered Medications  Medication Dose Route Frequency Provider Last Rate Last Dose  . 0.9 %  sodium chloride infusion   Intravenous Continuous Kathrine Haddock N, CNM 100 mL/hr at 09/01/17 0507       Objective: Vital signs in last 24 hours: Temp:  [97.9 F (36.6 C)-98.4 F (36.9 C)] 98.4 F (36.9 C) (08/10 1028) Pulse Rate:  [66-70] 66 (08/10 1028) Resp:  [18] 18 (08/10 1028) BP: (128-148)/(80-91) 138/82 (08/10 1028) SpO2:  [95 %-100 %] 99 % (08/10 1028) Weight:  [102.1 kg] 102.1 kg (08/10 0251)  Intake/Output from previous day: No intake/output  data recorded. Intake/Output this shift: No intake/output data recorded.   Physical Exam  Constitutional: She appears well-developed and well-nourished.  HENT:  Head: Normocephalic and atraumatic.  Neck: Normal range of motion. Neck supple.  Cardiovascular: Normal rate, regular rhythm and intact distal pulses.  Pulmonary/Chest: Effort normal. No respiratory distress.  Abdominal: Soft. There is tenderness (RUQ).  Obese.  Surgical port site wounds intact without evidence of infection.   Musculoskeletal: Normal range of motion. She exhibits no edema.  Neurological: She is alert.  Skin: Skin is warm and dry.  Psychiatric: She has a normal mood and affect.    Lab Results:  Recent Labs    09/01/17 0355  WBC 11.9*  HGB 12.0  HCT 34.7*  PLT 425*   BMET Recent Labs    09/01/17 0355  NA 134*  K 3.8  CL 98  CO2 23  GLUCOSE 168*  BUN 11  CREATININE 1.52*  CALCIUM 9.3   PT/INR No results for input(s): LABPROT, INR in the last 72 hours. ABG No results for input(s): PHART, HCO3 in the last 72 hours.  Invalid input(s): PCO2, PO2  Studies/Results: Ct Abdomen Pelvis W Contrast  Result Date: 09/01/2017 CLINICAL DATA:  Right flank pain, status post right oophorectomy and salpingectomy in July 2019, prior appendectomy. EXAM: CT ABDOMEN AND PELVIS WITH CONTRAST TECHNIQUE: Multidetector CT imaging of the abdomen and pelvis was performed using the standard protocol following bolus administration of intravenous contrast. CONTRAST:  14mL ISOVUE-300 IOPAMIDOL (ISOVUE-300) INJECTION 61% COMPARISON:  None. FINDINGS: Lower chest: Lung bases are clear. Hepatobiliary: Unenhanced liver is unremarkable. Gallbladder is unremarkable. No intrahepatic or extrahepatic ductal dilatation. Pancreas: Within normal limits. Spleen: Within normal limits. Adrenals/Urinary Tract: Adrenal glands are within normal limits. Heterogeneous/diminished enhancement of the right kidney. Mild right hydroureteronephrosis.  Mild perinephric/periureteral stranding, particularly along the course of the distal right ureter (series 5/image 46), which is not well visualized. Left kidney is within normal limits. On delayed imaging, there are no filling defects in the left proximal collecting system, ureter, or bladder. However, the right ureter remains unopacified. Stomach/Bowel: Stomach is within normal limits. No evidence of bowel obstruction. Prior appendectomy. Vascular/Lymphatic: No evidence of abdominal aortic aneurysm. Atherosclerotic calcifications of the abdominal aorta and branch vessels. Small retroperitoneal lymph nodes which do not meet pathologic CT size criteria. Reproductive: Uterus is within normal limits. Left ovary is within normal limits. Status post right oophorectomy. Other: No abdominopelvic ascites. No frank extraluminal contrast in the pelvis on delayed imaging. Postsurgical changes along the right paramidline anterior abdominal wall. Musculoskeletal: Visualized osseous structures are within normal limits. IMPRESSION: Heterogeneous/diminished  enhancement of the right kidney with mild right hydroureteronephrosis and associated perinephric/periureteral stranding. Pyelonephritis/pyonephrosis is a potential consideration in the appropriate clinical setting. However, given the provided clinical history, distal right ureteral injury is not excluded. Notably, there is no pelvic ascites or frank extraluminal contrast on delayed imaging. Consider urology consultation for assessment of the right ureter, as clinically warranted. These results will be called to the ordering clinician or representative by the Radiologist Assistant, and communication documented in the PACS or zVision Dashboard. Electronically Signed   By: Julian Hy M.D.   On: 09/01/2017 08:36     Assessment: Probable right ureteral injury with obstruction and pain.  Based on the presence of right efflux at the time of surgery, she probably has a  devascularization injury.   I will take her to the OR for cystoscopy with stent insertion.  I have reviewed the risks of bleeding, infection, ureteral injury, need for secondary procedures like a nephrostomy tube or reimplant, thrombotic events and anesthetic complications.   If the stent placement is successful, I will leave it for 6 weeks and then reassess.    CC: Lyn Hollingshead CNM, Dr. Mallory Shirk and Dr. Mora Bellman.      Irine Seal 09/01/2017 [phone]

## 2017-09-01 NOTE — Procedures (Signed)
Interventional Radiology Procedure Note  Procedure: Right percutaneous nephrostomy  Complications: None  Estimated Blood Loss: < 10 mL  Findings: Mild/moderate right hydronephrosis. 10 Fr PCN placed via LP access.  Difficult to fully form tube in collecting system due to buckling of tube in perinephric fat.  All side holes of tube in collecting system.  PCN connected to gravity bag.  Venetia Night. Kathlene Cote, M.D Pager:  [phone]

## 2017-09-01 NOTE — MAU Note (Addendum)
Was seen here Tues with pain R flank that goes down R side and down R leg. Was told to take Miralax for constipation. States is having watery BMs but no formed stools. Stopped taking Perocet as was told to do and take Ibuprofen 600mg  but is not helping. Unable to keep down anything

## 2017-09-02 ENCOUNTER — Encounter (HOSPITAL_COMMUNITY): Payer: Self-pay | Admitting: Urology

## 2017-09-02 LAB — BASIC METABOLIC PANEL
ANION GAP: 7 (ref 5–15)
BUN: 15 mg/dL (ref 6–20)
CALCIUM: 8.9 mg/dL (ref 8.9–10.3)
CO2: 25 mmol/L (ref 22–32)
Chloride: 104 mmol/L (ref 98–111)
Creatinine, Ser: 1.19 mg/dL — ABNORMAL HIGH (ref 0.44–1.00)
GFR, EST NON AFRICAN AMERICAN: 56 mL/min — AB (ref >60)
Glucose, Bld: 139 mg/dL — ABNORMAL HIGH (ref 70–99)
POTASSIUM: 4.5 mmol/L (ref 3.5–5.1)
Sodium: 136 mmol/L (ref 135–145)

## 2017-09-02 LAB — GLUCOSE, CAPILLARY
GLUCOSE-CAPILLARY: 191 mg/dL — AB (ref 70–99)
GLUCOSE-CAPILLARY: 128 mg/dL — AB (ref 70–99)
Glucose-Capillary: 156 mg/dL — ABNORMAL HIGH (ref 70–99)
Glucose-Capillary: 213 mg/dL — ABNORMAL HIGH (ref 70–99)

## 2017-09-02 NOTE — Progress Notes (Signed)
Referring Physician(s): Keene Breath  Supervising Physician: Aletta Edouard  Patient Status:  [facility] - In-pt  Chief Complaint:  Right flank pain, ureteral injury  Subjective: Pt doing fairly well this am; has some rt flank/lat abd soreness but not worsening; denies N/V   Allergies: Patient has no known allergies.  Medications: Prior to Admission medications   Medication Sig Start Date End Date Taking? Authorizing Provider  BAYER MICROLET LANCETS lancets Use as instructed two times daily 08/21/16  Yes Axel Filler, MD  Blood Glucose Monitoring Suppl (CONTOUR NEXT ONE) KIT 1 each by Does not apply route 2 (two) times daily. 08/21/16  Yes Axel Filler, MD  docusate sodium (COLACE) 100 MG capsule Take 1 capsule (100 mg total) by mouth 2 (two) times daily. 08/21/17  Yes Constant, Peggy, MD  gabapentin (NEURONTIN) 300 MG capsule Take 1 capsule (300 mg total) by mouth at bedtime. 06/05/17  Yes Aldine Contes, MD  glucose blood (CONTOUR NEXT TEST) test strip Use as instructed two times daily 08/21/16  Yes Axel Filler, MD  ibuprofen (ADVIL,MOTRIN) 600 MG tablet Take 1 tablet (600 mg total) by mouth every 6 (six) hours as needed. 08/21/17  Yes Constant, Peggy, MD  Insulin Pen Needle (PEN NEEDLES) 32G X 4 MM MISC 1 each by Does not apply route daily. 10/19/16  Yes Aldine Contes, MD  liraglutide (VICTOZA) 18 MG/3ML SOPN Inject 0.2 mLs (1.2 mg total) into the skin daily. 06/05/17  Yes Aldine Contes, MD  losartan-hydrochlorothiazide (HYZAAR) 50-12.5 MG tablet TAKE 1 TABLET BY MOUTH EVERY DAY Patient taking differently: Take 1 tablet by mouth daily.  05/08/17  Yes Aldine Contes, MD  metFORMIN (GLUCOPHAGE) 500 MG tablet TAKE 2 TABLETS BY MOUTH 2 TIMES DAILY WITH A MEAL. Patient taking differently: Take 1,000 mg by mouth 2 (two) times daily with a meal.  07/09/17  Yes Aldine Contes, MD  oxyCODONE-acetaminophen (PERCOCET/ROXICET) 5-325 MG tablet Take 1-2 tablets  by mouth every 6 (six) hours as needed. Patient taking differently: Take 1-2 tablets by mouth every 6 (six) hours as needed for moderate pain.  08/21/17  Yes Constant, Peggy, MD  polyethylene glycol (MIRALAX) packet Take 17 g by mouth daily. 08/28/17  Yes Weinhold, Samantha C, CNM  rosuvastatin (CRESTOR) 20 MG tablet TAKE 1 TABLET BY MOUTH EVERY DAY Patient taking differently: Take 20 mg by mouth daily.  08/23/17  Yes Aldine Contes, MD     Vital Signs: BP 125/72 (BP Location: Left Arm)   Pulse 72   Temp 98 F (36.7 C) (Oral)   Resp 15   Ht 5' 6"  (1.676 m)   Wt 225 lb (102.1 kg)   LMP 08/20/2017   SpO2 100%   BMI 36.32 kg/m   Physical Exam:  right PCN intact, dressing dry, site mildly tender, output 3 liters clear urine  Imaging: Ct Abdomen Pelvis W Contrast  Result Date: 09/01/2017 CLINICAL DATA:  Right flank pain, status post right oophorectomy and salpingectomy in July 2019, prior appendectomy. EXAM: CT ABDOMEN AND PELVIS WITH CONTRAST TECHNIQUE: Multidetector CT imaging of the abdomen and pelvis was performed using the standard protocol following bolus administration of intravenous contrast. CONTRAST:  46m ISOVUE-300 IOPAMIDOL (ISOVUE-300) INJECTION 61% COMPARISON:  None. FINDINGS: Lower chest: Lung bases are clear. Hepatobiliary: Unenhanced liver is unremarkable. Gallbladder is unremarkable. No intrahepatic or extrahepatic ductal dilatation. Pancreas: Within normal limits. Spleen: Within normal limits. Adrenals/Urinary Tract: Adrenal glands are within normal limits. Heterogeneous/diminished enhancement of the right kidney. Mild right hydroureteronephrosis. Mild  perinephric/periureteral stranding, particularly along the course of the distal right ureter (series 5/image 46), which is not well visualized. Left kidney is within normal limits. On delayed imaging, there are no filling defects in the left proximal collecting system, ureter, or bladder. However, the right ureter remains  unopacified. Stomach/Bowel: Stomach is within normal limits. No evidence of bowel obstruction. Prior appendectomy. Vascular/Lymphatic: No evidence of abdominal aortic aneurysm. Atherosclerotic calcifications of the abdominal aorta and branch vessels. Small retroperitoneal lymph nodes which do not meet pathologic CT size criteria. Reproductive: Uterus is within normal limits. Left ovary is within normal limits. Status post right oophorectomy. Other: No abdominopelvic ascites. No frank extraluminal contrast in the pelvis on delayed imaging. Postsurgical changes along the right paramidline anterior abdominal wall. Musculoskeletal: Visualized osseous structures are within normal limits. IMPRESSION: Heterogeneous/diminished enhancement of the right kidney with mild right hydroureteronephrosis and associated perinephric/periureteral stranding. Pyelonephritis/pyonephrosis is a potential consideration in the appropriate clinical setting. However, given the provided clinical history, distal right ureteral injury is not excluded. Notably, there is no pelvic ascites or frank extraluminal contrast on delayed imaging. Consider urology consultation for assessment of the right ureter, as clinically warranted. These results will be called to the ordering clinician or representative by the Radiologist Assistant, and communication documented in the PACS or zVision Dashboard. Electronically Signed   By: Julian Hy M.D.   On: 09/01/2017 08:36   Dg C-arm 1-60 Min-no Report  Result Date: 09/01/2017 Fluoroscopy was utilized by the requesting physician.  No radiographic interpretation.   Ir Nephrostomy Placement Right  Result Date: 09/01/2017 CLINICAL DATA:  Distal right ureteral injury after right laparoscopic salpingo oophorectomy on 08/21/2017. Inability to place a retrograde ureteral stent from a cystoscopic approach earlier today. The patient has now been referred for right percutaneous nephrostomy tube placement for  urinary diversion. EXAM: 1. ULTRASOUND GUIDANCE FOR PUNCTURE OF THE RIGHT RENAL COLLECTING SYSTEM. 2. RIGHT PERCUTANEOUS NEPHROSTOMY TUBE PLACEMENT. COMPARISON:  None. ANESTHESIA/SEDATION: 8.0 mg IV Versed; 200 mcg IV Fentanyl. Total Moderate Sedation Time 25 minutes The patient's level of consciousness and physiologic status were continuously monitored during the procedure by Radiology nursing. CONTRAST:  40 mL Isovue-300 MEDICATIONS: No additional medications. FLUOROSCOPY TIME:  5 minutes and 24 seconds.  225 mGy. PROCEDURE: The procedure, risks, benefits, and alternatives were explained to the patient. Questions regarding the procedure were encouraged and answered. The patient understands and consents to the procedure. A time-out was performed prior to initiating the procedure. Ultrasound was used to localize the right kidney. The right flank region was prepped with chlorhexidine in a sterile fashion, and a sterile drape was applied covering the operative field. A sterile gown and sterile gloves were used for the procedure. Local anesthesia was provided with 1% Lidocaine. Under direct ultrasound guidance, a 21 gauge needle was advanced into the renal collecting system. Ultrasound image documentation was performed. Aspiration of urine sample was performed followed by contrast injection. Second puncture of the renal collecting system was then performed under fluoroscopy of the lower pole collecting system with a 21 gauge needle. A guidewire was advanced. A transitional dilator was advanced over a guidewire. Percutaneous tract dilatation was then performed over the guidewire. A 10-French percutaneous nephrostomy tube was then advanced and formed in the collecting system. Catheter position was confirmed by fluoroscopy after contrast injection. The catheter was secured at the skin with a Prolene retention suture and Stat-Lock device. A gravity bag was placed. COMPLICATIONS: None. FINDINGS: Initial access was of the  upper pole collecting  system which allowed opacification of the collecting system. The lower pole collecting system was then accessed and eventual placement a 10 French nephrostomy tube was performed with the tube advanced into the renal pelvis. The tube could not be completely formed due to buckling of the tube in the perinephric fat. The majority of the catheter lies in the renal pelvis with some of the sideholes extending into a lower pole infundibulum. IMPRESSION: Placement of 10 French right percutaneous nephrostomy tube. This tube will be left to gravity bag drainage to divert urine. Electronically Signed   By: Aletta Edouard M.D.   On: 09/01/2017 16:45    Labs:  CBC: Recent Labs    08/15/17 1015 08/28/17 0641 09/01/17 0355  WBC 9.9 13.7* 11.9*  HGB 13.2 11.9* 12.0  HCT 38.8 34.8* 34.7*  PLT 393 432* 425*    COAGS: No results for input(s): INR, APTT in the last 8760 hours.  BMP: Recent Labs    08/15/17 1015 08/28/17 0641 09/01/17 0355 09/02/17 0434  NA 135 134* 134* 136  K 3.9 3.9 3.8 4.5  CL 101 97* 98 104  CO2 25 24 23 25   GLUCOSE 99 164* 168* 139*  BUN 13 12 11 15   CALCIUM 9.7 9.4 9.3 8.9  CREATININE 0.90 1.38* 1.52* 1.19*  GFRNONAA >60 47* 42* 56*  GFRAA >60 54* 48* >60    LIVER FUNCTION TESTS: Recent Labs    03/06/17 1050 08/28/17 0641 09/01/17 0355  BILITOT 0.4 0.6 0.8  AST 14 14* 12*  ALT 11 10 10   ALKPHOS 58 57 55  PROT 7.0 7.6 7.3  ALBUMIN 4.2 4.1 4.1    Assessment and Plan: Pt with hx rt ureteral injury following recent salpingo-oophorectomy with failed attempt at cystoscopic stent placement; s/p diverting rt PCN 8/10; afebrile; creat 1.19(1.52), output good; maintain PCN for now; for eventual rt ureteral reimplant per urology; as OP pt should record output of drain, change dressing every 1-2 days and call [phone] or [phone] with any drain related questions   Electronically Signed: D. Rowe Robert, PA-C 09/02/2017, 8:51 AM   I spent a  total of 15 minutes at the the patient's bedside AND on the patient's hospital floor or unit, greater than 50% of which was counseling/coordinating care for right nephrostomy    Patient ID: Traci Butler, female   DOB: 1975-06-24, 42 y.o.   MRN: [id number]

## 2017-09-02 NOTE — Progress Notes (Signed)
1 Day Post-Op  Subjective: CC: Right flank pain.  Hx: Traci Butler is doing better following right NT placement after a failed attempt at retrograde stenting for right distal ureteral obstruction following right SO.   She still has some pain and had nausea overnight but is improved.   Cr is down to 1.19 from 1.52.   ROS:  Review of Systems  Constitutional: Negative for chills and fever.  Genitourinary: Positive for flank pain.    Anti-infectives: Anti-infectives (From admission, onward)   Start     Dose/Rate Route Frequency Ordered Stop   09/01/17 2000  ceFAZolin (ANCEF) IVPB 1 g/50 mL premix     1 g 100 mL/hr over 30 Minutes Intravenous Every 8 hours 09/01/17 1350     09/01/17 1532  ceFAZolin (ANCEF) 2-4 GM/100ML-% IVPB  Status:  Discontinued    Note to Pharmacy:  Margaretmary Dys   : cabinet override      09/01/17 1532 09/01/17 1615   09/01/17 1138  ceFAZolin (ANCEF) IVPB 2g/100 mL premix     2 g 200 mL/hr over 30 Minutes Intravenous 30 min pre-op 09/01/17 1138 09/01/17 1215      Current Facility-Administered Medications  Medication Dose Route Frequency Provider Last Rate Last Dose  . 0.45 % NaCl with KCl 20 mEq / L infusion   Intravenous Continuous Irine Seal, MD 100 mL/hr at 09/01/17 1505    . 0.9 %  sodium chloride infusion   Intravenous Continuous Kathrine Haddock N, CNM 100 mL/hr at 09/01/17 0507    . acetaminophen (TYLENOL) tablet 650 mg  650 mg Oral Q4H PRN Irine Seal, MD      . bisacodyl (DULCOLAX) suppository 10 mg  10 mg Rectal Daily PRN Irine Seal, MD      . ceFAZolin (ANCEF) IVPB 1 g/50 mL premix  1 g Intravenous Q8H Irine Seal, MD 100 mL/hr at 09/02/17 0415 1 g at 09/02/17 0415  . diphenhydrAMINE (BENADRYL) injection 12.5-25 mg  12.5-25 mg Intravenous Q6H PRN Irine Seal, MD       Or  . diphenhydrAMINE (BENADRYL) 12.5 MG/5ML elixir 12.5-25 mg  12.5-25 mg Oral Q6H PRN Irine Seal, MD      . docusate sodium (COLACE) capsule 100 mg  100 mg Oral BID Irine Seal, MD   100 mg  at 09/01/17 2116  . gabapentin (NEURONTIN) capsule 300 mg  300 mg Oral QHS Irine Seal, MD   300 mg at 09/01/17 2116  . losartan (COZAAR) tablet 50 mg  50 mg Oral Daily Irine Seal, MD       And  . hydrochlorothiazide (MICROZIDE) capsule 12.5 mg  12.5 mg Oral Daily Irine Seal, MD      . HYDROmorphone (DILAUDID) injection 0.5-1 mg  0.5-1 mg Intravenous Q2H PRN Irine Seal, MD   0.5 mg at 09/02/17 0059  . hyoscyamine (LEVSIN SL) SL tablet 0.125 mg  0.125 mg Sublingual Q4H PRN Irine Seal, MD      . insulin aspart (novoLOG) injection 0-15 Units  0-15 Units Subcutaneous TID WC Irine Seal, MD   3 Units at 09/02/17 [phone]  . liraglutide (VICTOZA) SOPN 1.2 mg  1.2 mg Subcutaneous Daily Irine Seal, MD      . Derrill Memo ON 09/03/2017] metFORMIN (GLUCOPHAGE) tablet 500 mg  500 mg Oral BID WC Irine Seal, MD      . ondansetron [facility]) injection 4 mg  4 mg Intravenous Q4H PRN Irine Seal, MD   4 mg at 09/02/17 0519  . oxyCODONE (Oxy IR/ROXICODONE)  immediate release tablet 5 mg  5 mg Oral Q4H PRN Irine Seal, MD   5 mg at 09/02/17 0059  . rosuvastatin (CRESTOR) tablet 20 mg  20 mg Oral Daily Irine Seal, MD      . senna-docusate (Senokot-S) tablet 1 tablet  1 tablet Oral QHS PRN Irine Seal, MD      . sodium chloride flush (NS) 0.9 % injection 5 mL  5 mL Intracatheter Q8H Aletta Edouard, MD   5 mL at 09/02/17 0519  . sodium phosphate (FLEET) 7-19 GM/118ML enema 1 enema  1 enema Rectal Once PRN Irine Seal, MD      . zolpidem (AMBIEN) tablet 5 mg  5 mg Oral QHS PRN Irine Seal, MD   5 mg at 09/01/17 2240     Objective: Vital signs in last 24 hours: Temp:  [97.8 F (36.6 C)-98.6 F (37 C)] 98 F (36.7 C) (08/11 0430) Pulse Rate:  [60-104] 72 (08/11 0430) Resp:  [12-25] 15 (08/11 0430) BP: (119-189)/(67-112) 125/72 (08/11 0430) SpO2:  [95 %-100 %] 100 % (08/11 0430)  Intake/Output from previous day: 08/10 0701 - 08/11 0700 In: 1100 [I.V.:1000; IV Piggyback:100] Out: 2975 [Urine:2975] Intake/Output  this shift: Total I/O In: -  Out: 300 [Urine:300]   Physical Exam  Constitutional: She is oriented to person, place, and time. She appears well-developed and well-nourished.  Cardiovascular: Normal rate and regular rhythm.  Pulmonary/Chest: Effort normal. No respiratory distress.  Abdominal: Soft. She exhibits distension (RUQ).  Musculoskeletal: Normal range of motion. She exhibits no edema.  Neurological: She is alert and oriented to person, place, and time.  Vitals reviewed.   Lab Results:  Recent Labs    09/01/17 0355  WBC 11.9*  HGB 12.0  HCT 34.7*  PLT 425*   BMET Recent Labs    09/01/17 0355 09/02/17 0434  NA 134* 136  K 3.8 4.5  CL 98 104  CO2 23 25  GLUCOSE 168* 139*  BUN 11 15  CREATININE 1.52* 1.19*  CALCIUM 9.3 8.9   PT/INR No results for input(s): LABPROT, INR in the last 72 hours. ABG No results for input(s): PHART, HCO3 in the last 72 hours.  Invalid input(s): PCO2, PO2  Studies/Results: Ct Abdomen Pelvis W Contrast  Result Date: 09/01/2017 CLINICAL DATA:  Right flank pain, status post right oophorectomy and salpingectomy in July 2019, prior appendectomy. EXAM: CT ABDOMEN AND PELVIS WITH CONTRAST TECHNIQUE: Multidetector CT imaging of the abdomen and pelvis was performed using the standard protocol following bolus administration of intravenous contrast. CONTRAST:  20mL ISOVUE-300 IOPAMIDOL (ISOVUE-300) INJECTION 61% COMPARISON:  None. FINDINGS: Lower chest: Lung bases are clear. Hepatobiliary: Unenhanced liver is unremarkable. Gallbladder is unremarkable. No intrahepatic or extrahepatic ductal dilatation. Pancreas: Within normal limits. Spleen: Within normal limits. Adrenals/Urinary Tract: Adrenal glands are within normal limits. Heterogeneous/diminished enhancement of the right kidney. Mild right hydroureteronephrosis. Mild perinephric/periureteral stranding, particularly along the course of the distal right ureter (series 5/image 46), which is not well  visualized. Left kidney is within normal limits. On delayed imaging, there are no filling defects in the left proximal collecting system, ureter, or bladder. However, the right ureter remains unopacified. Stomach/Bowel: Stomach is within normal limits. No evidence of bowel obstruction. Prior appendectomy. Vascular/Lymphatic: No evidence of abdominal aortic aneurysm. Atherosclerotic calcifications of the abdominal aorta and branch vessels. Small retroperitoneal lymph nodes which do not meet pathologic CT size criteria. Reproductive: Uterus is within normal limits. Left ovary is within normal limits. Status post right oophorectomy. Other:  No abdominopelvic ascites. No frank extraluminal contrast in the pelvis on delayed imaging. Postsurgical changes along the right paramidline anterior abdominal wall. Musculoskeletal: Visualized osseous structures are within normal limits. IMPRESSION: Heterogeneous/diminished enhancement of the right kidney with mild right hydroureteronephrosis and associated perinephric/periureteral stranding. Pyelonephritis/pyonephrosis is a potential consideration in the appropriate clinical setting. However, given the provided clinical history, distal right ureteral injury is not excluded. Notably, there is no pelvic ascites or frank extraluminal contrast on delayed imaging. Consider urology consultation for assessment of the right ureter, as clinically warranted. These results will be called to the ordering clinician or representative by the Radiologist Assistant, and communication documented in the PACS or zVision Dashboard. Electronically Signed   By: Julian Hy M.D.   On: 09/01/2017 08:36   Dg C-arm 1-60 Min-no Report  Result Date: 09/01/2017 Fluoroscopy was utilized by the requesting physician.  No radiographic interpretation.   Ir Nephrostomy Placement Right  Result Date: 09/01/2017 CLINICAL DATA:  Distal right ureteral injury after right laparoscopic salpingo oophorectomy on  08/21/2017. Inability to place a retrograde ureteral stent from a cystoscopic approach earlier today. The patient has now been referred for right percutaneous nephrostomy tube placement for urinary diversion. EXAM: 1. ULTRASOUND GUIDANCE FOR PUNCTURE OF THE RIGHT RENAL COLLECTING SYSTEM. 2. RIGHT PERCUTANEOUS NEPHROSTOMY TUBE PLACEMENT. COMPARISON:  None. ANESTHESIA/SEDATION: 8.0 mg IV Versed; 200 mcg IV Fentanyl. Total Moderate Sedation Time 25 minutes The patient's level of consciousness and physiologic status were continuously monitored during the procedure by Radiology nursing. CONTRAST:  40 mL Isovue-300 MEDICATIONS: No additional medications. FLUOROSCOPY TIME:  5 minutes and 24 seconds.  225 mGy. PROCEDURE: The procedure, risks, benefits, and alternatives were explained to the patient. Questions regarding the procedure were encouraged and answered. The patient understands and consents to the procedure. A time-out was performed prior to initiating the procedure. Ultrasound was used to localize the right kidney. The right flank region was prepped with chlorhexidine in a sterile fashion, and a sterile drape was applied covering the operative field. A sterile gown and sterile gloves were used for the procedure. Local anesthesia was provided with 1% Lidocaine. Under direct ultrasound guidance, a 21 gauge needle was advanced into the renal collecting system. Ultrasound image documentation was performed. Aspiration of urine sample was performed followed by contrast injection. Second puncture of the renal collecting system was then performed under fluoroscopy of the lower pole collecting system with a 21 gauge needle. A guidewire was advanced. A transitional dilator was advanced over a guidewire. Percutaneous tract dilatation was then performed over the guidewire. A 10-French percutaneous nephrostomy tube was then advanced and formed in the collecting system. Catheter position was confirmed by fluoroscopy after  contrast injection. The catheter was secured at the skin with a Prolene retention suture and Stat-Lock device. A gravity bag was placed. COMPLICATIONS: None. FINDINGS: Initial access was of the upper pole collecting system which allowed opacification of the collecting system. The lower pole collecting system was then accessed and eventual placement a 10 French nephrostomy tube was performed with the tube advanced into the renal pelvis. The tube could not be completely formed due to buckling of the tube in the perinephric fat. The majority of the catheter lies in the renal pelvis with some of the sideholes extending into a lower pole infundibulum. IMPRESSION: Placement of 10 French right percutaneous nephrostomy tube. This tube will be left to gravity bag drainage to divert urine. Electronically Signed   By: Aletta Edouard M.D.   On: 09/01/2017 16:45  Assessment and Plan: Right distal ureteral injury with obstruction now s/p right NT placement.   I will monitor overnight and plan to D/c in AM.  She will need right ureteral reimplant on a delayed basis.       LOS: 0 days    Irine Seal 09/02/2017 [phone] ID: Traci Butler, female   DOB: December 13, 1975, 42 y.o.   MRN: [id number]

## 2017-09-03 DIAGNOSIS — N131 Hydronephrosis with ureteral stricture, not elsewhere classified: Secondary | ICD-10-CM | POA: Diagnosis present

## 2017-09-03 DIAGNOSIS — N132 Hydronephrosis with renal and ureteral calculous obstruction: Secondary | ICD-10-CM | POA: Diagnosis present

## 2017-09-03 LAB — BASIC METABOLIC PANEL
Anion gap: 7 (ref 5–15)
BUN: 13 mg/dL (ref 6–20)
CO2: 27 mmol/L (ref 22–32)
CREATININE: 1.13 mg/dL — AB (ref 0.44–1.00)
Calcium: 9 mg/dL (ref 8.9–10.3)
Chloride: 105 mmol/L (ref 98–111)
GFR calc Af Amer: >60 mL/min (ref >60)
GFR, EST NON AFRICAN AMERICAN: 60 mL/min — AB (ref >60)
Glucose, Bld: 126 mg/dL — ABNORMAL HIGH (ref 70–99)
POTASSIUM: 4.3 mmol/L (ref 3.5–5.1)
SODIUM: 139 mmol/L (ref 135–145)

## 2017-09-03 LAB — GLUCOSE, CAPILLARY
GLUCOSE-CAPILLARY: 114 mg/dL — AB (ref 70–99)
Glucose-Capillary: 134 mg/dL — ABNORMAL HIGH (ref 70–99)

## 2017-09-03 NOTE — Discharge Summary (Signed)
Physician Discharge Summary  Patient ID: Traci Butler MRN: [id number] DOB/AGE: [birth year] 42 y.o.  Admit date: 09/01/2017 Discharge date: 09/03/2017  Admission Diagnoses:  Right ureteral injury  Discharge Diagnoses:  Principal Problem:   Right ureteral injury Active Problems:   Hydronephrosis due to obstruction of ureter   Past Medical History:  Diagnosis Date  . Diabetes mellitus without complication ([place])   . High cholesterol   . Hypertension     Surgeries: Procedure(s): CYSTOSCOPY WITH RETROGRADE PYELOGRAM/URETEROSCOPY on 09/01/2017   Consultants (if any):   Discharged Condition: Improved  Hospital Course: Traci Butler is an 42 y.o. female who was admitted 09/01/2017 with a diagnosis of Right ureteral injury  From a Salpyngoophorectomy on 08/21/17  and went to the operating room on 09/01/2017 and underwent the above named procedures.  She was found to have severe ureteral obstruction with some necrosis and a stent could not be passed.   She had a right NT placement and is now doing well.   She will need 4-6 weeks to allow resolution of postop inflammation before definitive therapy is attempted.    She was given perioperative antibiotics:  Anti-infectives (From admission, onward)   Start     Dose/Rate Route Frequency Ordered Stop   09/01/17 2000  ceFAZolin (ANCEF) IVPB 1 g/50 mL premix     1 g 100 mL/hr over 30 Minutes Intravenous Every 8 hours 09/01/17 1350     09/01/17 1532  ceFAZolin (ANCEF) 2-4 GM/100ML-% IVPB  Status:  Discontinued    Note to Pharmacy:  Margaretmary Dys   : cabinet override      09/01/17 1532 09/01/17 1615   09/01/17 1138  ceFAZolin (ANCEF) IVPB 2g/100 mL premix     2 g 200 mL/hr over 30 Minutes Intravenous 30 min pre-op 09/01/17 1138 09/01/17 1215    .  She was given sequential compression devices for DVT prophylaxis.  She benefited maximally from the hospital stay and there were no complications.    Recent vital signs:  Vitals:   09/02/17  2111 09/03/17 0508  BP: (!) 143/95 119/80  Pulse: 76 62  Resp: 12 12  Temp: 98.3 F (36.8 C) 98.2 F (36.8 C)  SpO2: 100% 100%    Recent laboratory studies:  Lab Results  Component Value Date   HGB 12.0 09/01/2017   HGB 11.9 (L) 08/28/2017   HGB 13.2 08/15/2017   Lab Results  Component Value Date   WBC 11.9 (H) 09/01/2017   PLT 425 (H) 09/01/2017   No results found for: INR Lab Results  Component Value Date   NA 139 09/03/2017   K 4.3 09/03/2017   CL 105 09/03/2017   CO2 27 09/03/2017   BUN 13 09/03/2017   CREATININE 1.13 (H) 09/03/2017   GLUCOSE 126 (H) 09/03/2017    Discharge Medications:   Allergies as of 09/03/2017   No Known Allergies     Medication List    TAKE these medications   BAYER MICROLET LANCETS lancets Use as instructed two times daily   CONTOUR NEXT ONE Kit 1 each by Does not apply route 2 (two) times daily.   docusate sodium 100 MG capsule Commonly known as:  COLACE Take 1 capsule (100 mg total) by mouth 2 (two) times daily.   gabapentin 300 MG capsule Commonly known as:  NEURONTIN Take 1 capsule (300 mg total) by mouth at bedtime.   glucose blood test strip Use as instructed two times daily   ibuprofen 600 MG tablet  Commonly known as:  ADVIL,MOTRIN Take 1 tablet (600 mg total) by mouth every 6 (six) hours as needed.   liraglutide 18 MG/3ML Sopn Commonly known as:  VICTOZA Inject 0.2 mLs (1.2 mg total) into the skin daily.   losartan-hydrochlorothiazide 50-12.5 MG tablet Commonly known as:  HYZAAR TAKE 1 TABLET BY MOUTH EVERY DAY   metFORMIN 500 MG tablet Commonly known as:  GLUCOPHAGE TAKE 2 TABLETS BY MOUTH 2 TIMES DAILY WITH A MEAL. What changed:  See the new instructions.   oxyCODONE-acetaminophen 5-325 MG tablet Commonly known as:  PERCOCET/ROXICET Take 1-2 tablets by mouth every 6 (six) hours as needed. What changed:  reasons to take this   Pen Needles 32G X 4 MM Misc 1 each by Does not apply route daily.    polyethylene glycol packet Commonly known as:  MIRALAX / GLYCOLAX Take 17 g by mouth daily.   rosuvastatin 20 MG tablet Commonly known as:  CRESTOR TAKE 1 TABLET BY MOUTH EVERY DAY       Diagnostic Studies: Dg Abdomen 1 View  Result Date: 08/28/2017 CLINICAL DATA:  Postoperative abdominal pain. One week postop from right salpingo oophorectomy. EXAM: ABDOMEN - 1 VIEW COMPARISON:  None. FINDINGS: No evidence of dilated bowel loops. Moderate amount of stool is seen throughout the colon. No radiopaque calculi identified. IMPRESSION: Unremarkable bowel gas pattern.  No acute findings. Electronically Signed   By: Earle Gell M.D.   On: 08/28/2017 07:11   Ct Abdomen Pelvis W Contrast  Result Date: 09/01/2017 CLINICAL DATA:  Right flank pain, status post right oophorectomy and salpingectomy in July 2019, prior appendectomy. EXAM: CT ABDOMEN AND PELVIS WITH CONTRAST TECHNIQUE: Multidetector CT imaging of the abdomen and pelvis was performed using the standard protocol following bolus administration of intravenous contrast. CONTRAST:  29m ISOVUE-300 IOPAMIDOL (ISOVUE-300) INJECTION 61% COMPARISON:  None. FINDINGS: Lower chest: Lung bases are clear. Hepatobiliary: Unenhanced liver is unremarkable. Gallbladder is unremarkable. No intrahepatic or extrahepatic ductal dilatation. Pancreas: Within normal limits. Spleen: Within normal limits. Adrenals/Urinary Tract: Adrenal glands are within normal limits. Heterogeneous/diminished enhancement of the right kidney. Mild right hydroureteronephrosis. Mild perinephric/periureteral stranding, particularly along the course of the distal right ureter (series 5/image 46), which is not well visualized. Left kidney is within normal limits. On delayed imaging, there are no filling defects in the left proximal collecting system, ureter, or bladder. However, the right ureter remains unopacified. Stomach/Bowel: Stomach is within normal limits. No evidence of bowel obstruction.  Prior appendectomy. Vascular/Lymphatic: No evidence of abdominal aortic aneurysm. Atherosclerotic calcifications of the abdominal aorta and branch vessels. Small retroperitoneal lymph nodes which do not meet pathologic CT size criteria. Reproductive: Uterus is within normal limits. Left ovary is within normal limits. Status post right oophorectomy. Other: No abdominopelvic ascites. No frank extraluminal contrast in the pelvis on delayed imaging. Postsurgical changes along the right paramidline anterior abdominal wall. Musculoskeletal: Visualized osseous structures are within normal limits. IMPRESSION: Heterogeneous/diminished enhancement of the right kidney with mild right hydroureteronephrosis and associated perinephric/periureteral stranding. Pyelonephritis/pyonephrosis is a potential consideration in the appropriate clinical setting. However, given the provided clinical history, distal right ureteral injury is not excluded. Notably, there is no pelvic ascites or frank extraluminal contrast on delayed imaging. Consider urology consultation for assessment of the right ureter, as clinically warranted. These results will be called to the ordering clinician or representative by the Radiologist Assistant, and communication documented in the PACS or zVision Dashboard. Electronically Signed   By: SJulian HyM.D.   On:  09/01/2017 08:36   Dg C-arm 1-60 Min-no Report  Result Date: 09/01/2017 Fluoroscopy was utilized by the requesting physician.  No radiographic interpretation.   Ir Nephrostomy Placement Right  Result Date: 09/01/2017 CLINICAL DATA:  Distal right ureteral injury after right laparoscopic salpingo oophorectomy on 08/21/2017. Inability to place a retrograde ureteral stent from a cystoscopic approach earlier today. The patient has now been referred for right percutaneous nephrostomy tube placement for urinary diversion. EXAM: 1. ULTRASOUND GUIDANCE FOR PUNCTURE OF THE RIGHT RENAL COLLECTING  SYSTEM. 2. RIGHT PERCUTANEOUS NEPHROSTOMY TUBE PLACEMENT. COMPARISON:  None. ANESTHESIA/SEDATION: 8.0 mg IV Versed; 200 mcg IV Fentanyl. Total Moderate Sedation Time 25 minutes The patient's level of consciousness and physiologic status were continuously monitored during the procedure by Radiology nursing. CONTRAST:  40 mL Isovue-300 MEDICATIONS: No additional medications. FLUOROSCOPY TIME:  5 minutes and 24 seconds.  225 mGy. PROCEDURE: The procedure, risks, benefits, and alternatives were explained to the patient. Questions regarding the procedure were encouraged and answered. The patient understands and consents to the procedure. A time-out was performed prior to initiating the procedure. Ultrasound was used to localize the right kidney. The right flank region was prepped with chlorhexidine in a sterile fashion, and a sterile drape was applied covering the operative field. A sterile gown and sterile gloves were used for the procedure. Local anesthesia was provided with 1% Lidocaine. Under direct ultrasound guidance, a 21 gauge needle was advanced into the renal collecting system. Ultrasound image documentation was performed. Aspiration of urine sample was performed followed by contrast injection. Second puncture of the renal collecting system was then performed under fluoroscopy of the lower pole collecting system with a 21 gauge needle. A guidewire was advanced. A transitional dilator was advanced over a guidewire. Percutaneous tract dilatation was then performed over the guidewire. A 10-French percutaneous nephrostomy tube was then advanced and formed in the collecting system. Catheter position was confirmed by fluoroscopy after contrast injection. The catheter was secured at the skin with a Prolene retention suture and Stat-Lock device. A gravity bag was placed. COMPLICATIONS: None. FINDINGS: Initial access was of the upper pole collecting system which allowed opacification of the collecting system. The lower  pole collecting system was then accessed and eventual placement a 10 French nephrostomy tube was performed with the tube advanced into the renal pelvis. The tube could not be completely formed due to buckling of the tube in the perinephric fat. The majority of the catheter lies in the renal pelvis with some of the sideholes extending into a lower pole infundibulum. IMPRESSION: Placement of 10 French right percutaneous nephrostomy tube. This tube will be left to gravity bag drainage to divert urine. Electronically Signed   By: Aletta Edouard M.D.   On: 09/01/2017 16:45    Disposition: Discharge disposition: 01-Home or Self Care       Discharge Instructions    Discontinue IV   Complete by:  As directed       [place] .   Specialty:  Radiology Contact information: [address] [id number] [place] [phone]       Irine Seal, MD Follow up.   Specialty:  Urology Why:  I will have my office contact you about a f/u appointment but if you haven't heard back by thursday please call to arrange follow up with Dr. Lovena Neighbours in my office.  Contact information: [place] [address] [phone]  Signed: Irine Seal 09/03/2017, 7:26 AM

## 2017-09-03 NOTE — Discharge Instructions (Signed)
Percutaneous Nephrostomy, Care After  This sheet gives you information about how to care for yourself after your procedure. Your health care provider may also give you more specific instructions. If you have problems or questions, contact your health care provider.  What can I expect after the procedure?  After the procedure, it is common to have:  · Some soreness where the nephrostomy tube was inserted (tube insertion site).  · Blood-tinged drainage from the nephrostomy tube for the first 24 hours.    Follow these instructions at home:  Activity  · Return to your normal activities as told by your health care provider. Ask your health care provider what activities are safe for you.  · Avoid activities that may cause the nephrostomy tubing to bend.  · Do not take baths, swim, or use a hot tub until your health care provider approves. Ask your health care provider if you can take showers. Cover the nephrostomy tube dressing with a watertight covering when you take a shower.  · Do not drive for 24 hours if you were given a medicine to help you relax (sedative).  Care of the tube insertion site  · Follow instructions from your health care provider about how to take care of your tube insertion site. Make sure you:  ? Wash your hands with soap and water before you change your bandage (dressing). If soap and water are not available, use hand sanitizer.  ? Change your dressing as told by your health care provider. Be careful not to pull on the tube while removing the dressing.  ? When you change the dressing, wash the skin around the tube, rinse well, and pat the skin dry.  · Check the tube insertion area every day for signs of infection. Check for:  ? More redness, swelling, or pain.  ? More fluid or blood.  ? Warmth.  ? Pus or a bad smell.  Care of the nephrostomy tube and drainage bag  · Always keep the tubing, the leg bag, or the bedside drainage bags below the level of the kidney so that your urine drains  freely.  · When connecting your nephrostomy tube to a drainage bag, make sure that there are no kinks in the tubing and that your urine is draining freely. You may want to use an elastic bandage to wrap any exposed tubing that goes from the nephrostomy tube to any of the connecting tubes.  · At night, you may want to connect your nephrostomy tube or the leg bag to a larger bedside drainage bag.  · Follow instructions from your health care provider about how to empty or change the drainage bag.  · Empty the drainage bag when it becomes ? full.  · Replace the drainage bag and any extension tubing that is connected to your nephrostomy tube every 3 weeks or as often as told by your health care provider. Your health care provider will explain how to change the drainage bag and extension tubing.  General instructions  · Take over-the-counter and prescription medicines only as told by your health care provider.  · Keep all follow-up visits as told by your health care provider. This is important.  Contact a health care provider if:  · You have problems with any of the valves or tubing.  · You have persistent pain or soreness in your back.  · You have more redness, swelling, or pain around your tube insertion site.  · You have more fluid or blood coming from   your tube insertion site.  · Your tube insertion site feels warm to the touch.  · You have pus or a bad smell coming from your tube insertion site.  · You have increased urine output or you feel burning when urinating.  Get help right away if:  · You have pain in your abdomen during the first week.  · You have chest pain or have trouble breathing.  · You have a new appearance of blood in your urine.  · You have a fever or chills.  · You have back pain that is not relieved by your medicine.  · You have decreased urine output.  · Your nephrostomy tube comes out.  This information is not intended to replace advice given to you by your health care provider. Make sure you  discuss any questions you have with your health care provider.  Document Released: 09/02/2003 Document Revised: 10/22/2015 Document Reviewed: 10/22/2015  Elsevier Interactive Patient Education © 2018 [employer]

## 2017-09-10 ENCOUNTER — Ambulatory Visit (INDEPENDENT_AMBULATORY_CARE_PROVIDER_SITE_OTHER): Payer: BLUE CROSS/BLUE SHIELD | Admitting: Obstetrics and Gynecology

## 2017-09-10 ENCOUNTER — Encounter: Payer: Self-pay | Admitting: Obstetrics and Gynecology

## 2017-09-10 VITALS — BP 146/89 | HR 90 | Ht 66.0 in | Wt 231.0 lb

## 2017-09-10 DIAGNOSIS — Z9889 Other specified postprocedural states: Secondary | ICD-10-CM

## 2017-09-10 NOTE — Progress Notes (Signed)
42 yo here for post op check s/p right salping oophorectomy. Patient readmitted to the hospital on 8/10 with right ureteral injury. Nephrostomy tube was placed with plans for robotic assisted re-implantation in 4- 6 weeks. Patient reports feeling much better since her hospitalization. She denies any abdominal pain or pain from nephrostomy tube site. She is eating well and has returned to her normal activities. She is extremely frustrated at the fact that she cannot return to work. She worries about her financial responsibilities as she does not qualify for short term disability.  Past Medical History:  Diagnosis Date  . Diabetes mellitus without complication ([place])   . High cholesterol   . Hypertension    Past Surgical History:  Procedure Laterality Date  . APPENDECTOMY  2008  . CESAREAN SECTION  2005   live birth  . CESAREAN SECTION  2011   live birth  . CYSTOSCOPY N/A 08/21/2017   Procedure: CYSTOSCOPY;  Surgeon: Mora Bellman, MD;  Location: [facility];  Service: Gynecology;  Laterality: N/A;  . CYSTOSCOPY W/ URETERAL STENT PLACEMENT Right 09/01/2017   Procedure: CYSTOSCOPY WITH RETROGRADE PYELOGRAM/URETEROSCOPY;  Surgeon: Irine Seal, MD;  Location: [facility];  Service: Urology;  Laterality: Right;  . IR NEPHROSTOMY PLACEMENT RIGHT  09/01/2017  . LAPAROSCOPIC SALPINGO OOPHERECTOMY Right 08/21/2017   Procedure: LAPAROSCOPIC RIGHT SALPINGO OOPHORECTOMY;  Surgeon: Mora Bellman, MD;  Location: [facility];  Service: Gynecology;  Laterality: Right;  . TONSILLECTOMY    . WISDOM TOOTH EXTRACTION  2015   Family History  Problem Relation Age of Onset  . Diabetes Mother   . Hypertension Father    Social History   Tobacco Use  . Smoking status: Current Some Day Smoker    Packs/day: 0.50    Years: 25.00    Pack years: 12.50  . Smokeless tobacco: Never Used  . Tobacco comment: trying  Substance Use Topics  . Alcohol use: Yes    Alcohol/week: 2.0 standard drinks    Types: 2 Shots of liquor per  week    Comment: WEEKENDS ONLY  . Drug use: No   ROS See pertinent in HPI  Blood pressure (!) 146/89, pulse 90, height 5\' 6"  (1.676 m), weight 231 lb (104.8 kg), last menstrual period 08/20/2017. GENERAL: Well-developed, well-nourished female in no acute distress.  LUNGS: Clear to auscultation bilaterally.  HEART: Regular rate and rhythm. ABDOMEN: Soft, nontender, nondistended. No organomegaly. Incision healed well x 3. Nephrostomy site intact PELVIC: Not indicated EXTREMITIES: No cyanosis, clubbing, or edema, 2+ distal pulses.  A/P 42 yo here for postop check s/p RSO complicated by right ureteral injury - Patient is medically cleared from GYN stand point to resume all activities of daily living - Patient is scheduled to follow up with urology today and plans to discuss return to work options - Pathology results reviewed with the patient - RTC in April for annual exam or prn

## 2017-09-12 ENCOUNTER — Other Ambulatory Visit: Payer: Self-pay | Admitting: Urology

## 2017-09-12 DIAGNOSIS — S3710XD Unspecified injury of ureter, subsequent encounter: Secondary | ICD-10-CM

## 2017-09-14 ENCOUNTER — Other Ambulatory Visit: Payer: Self-pay | Admitting: Urology

## 2017-09-18 ENCOUNTER — Telehealth: Payer: Self-pay | Admitting: *Deleted

## 2017-09-18 NOTE — Telephone Encounter (Signed)
Patient states she has been taken out of work by MD till 10/23/2017. States she was able to get all her meds free except victoza. She is not able to get this med 2/2 cost. Wants to know if we can help her obtain this med. Will forward to PCP and Tawni Pummel, RN, BSN

## 2017-09-18 NOTE — Telephone Encounter (Signed)
I think we can get this for her. WIll have to ask Dr. Maudie Mercury. Will forward this to her as well

## 2017-09-18 NOTE — Telephone Encounter (Signed)
Will reach out to patient

## 2017-09-19 ENCOUNTER — Ambulatory Visit: Payer: BLUE CROSS/BLUE SHIELD | Admitting: Pharmacist

## 2017-09-19 NOTE — Telephone Encounter (Signed)
That sounds good. Let's switch her to [place]. Thank you Dr. Maudie Mercury

## 2017-09-19 NOTE — Telephone Encounter (Signed)
Thank you. I appreciate all the work Dr. Maudie Mercury

## 2017-09-19 NOTE — Telephone Encounter (Signed)
False alarm, Dr. Dareen Piano (please disregard previous message).   The patient's Victoza copay came down to $0 after paying her deductible. I advised her also to schedule a follow up appointment with you.  Thank you!

## 2017-10-02 ENCOUNTER — Encounter: Payer: Self-pay | Admitting: Internal Medicine

## 2017-10-02 ENCOUNTER — Ambulatory Visit: Payer: BLUE CROSS/BLUE SHIELD | Admitting: Internal Medicine

## 2017-10-02 ENCOUNTER — Other Ambulatory Visit: Payer: Self-pay

## 2017-10-02 VITALS — BP 121/79 | HR 91 | Temp 98.4°F | Ht 66.0 in | Wt 230.6 lb

## 2017-10-02 DIAGNOSIS — E1142 Type 2 diabetes mellitus with diabetic polyneuropathy: Secondary | ICD-10-CM

## 2017-10-02 DIAGNOSIS — Z7984 Long term (current) use of oral hypoglycemic drugs: Secondary | ICD-10-CM

## 2017-10-02 DIAGNOSIS — E1169 Type 2 diabetes mellitus with other specified complication: Secondary | ICD-10-CM

## 2017-10-02 DIAGNOSIS — E782 Mixed hyperlipidemia: Secondary | ICD-10-CM

## 2017-10-02 DIAGNOSIS — E1140 Type 2 diabetes mellitus with diabetic neuropathy, unspecified: Secondary | ICD-10-CM

## 2017-10-02 DIAGNOSIS — Z0000 Encounter for general adult medical examination without abnormal findings: Secondary | ICD-10-CM

## 2017-10-02 DIAGNOSIS — Z79899 Other long term (current) drug therapy: Secondary | ICD-10-CM

## 2017-10-02 DIAGNOSIS — I10 Essential (primary) hypertension: Secondary | ICD-10-CM | POA: Diagnosis not present

## 2017-10-02 DIAGNOSIS — Z936 Other artificial openings of urinary tract status: Secondary | ICD-10-CM

## 2017-10-02 DIAGNOSIS — N132 Hydronephrosis with renal and ureteral calculous obstruction: Secondary | ICD-10-CM

## 2017-10-02 DIAGNOSIS — Z90721 Acquired absence of ovaries, unilateral: Secondary | ICD-10-CM

## 2017-10-02 DIAGNOSIS — Z87448 Personal history of other diseases of urinary system: Secondary | ICD-10-CM

## 2017-10-02 DIAGNOSIS — N131 Hydronephrosis with ureteral stricture, not elsewhere classified: Secondary | ICD-10-CM

## 2017-10-02 DIAGNOSIS — Z6837 Body mass index (BMI) 37.0-37.9, adult: Secondary | ICD-10-CM

## 2017-10-02 DIAGNOSIS — Z23 Encounter for immunization: Secondary | ICD-10-CM | POA: Diagnosis not present

## 2017-10-02 DIAGNOSIS — Z8742 Personal history of other diseases of the female genital tract: Secondary | ICD-10-CM

## 2017-10-02 LAB — POCT GLYCOSYLATED HEMOGLOBIN (HGB A1C): Hemoglobin A1C: 6.9 % — AB (ref 4.0–5.6)

## 2017-10-02 LAB — GLUCOSE, CAPILLARY: Glucose-Capillary: 102 mg/dL — ABNORMAL HIGH (ref 70–99)

## 2017-10-02 NOTE — Assessment & Plan Note (Signed)
-  This problem is chronic and stable -Patient states that her symptoms are improved on gabapentin 300 mg daily at night but she does occasionally have nights where the tingling and numbness in her fingers are worse. -Patient will continue with gabapentin 300 mg for now and to take an extra half a tablet if the tingling and numbness in her hands are worse -No further work-up at this time

## 2017-10-02 NOTE — Assessment & Plan Note (Signed)
Flu shot given today

## 2017-10-02 NOTE — Assessment & Plan Note (Signed)
-  This problem is chronic and stable -Patient is compliant with rosuvastatin 20 mg daily -We will check a lipid profile today

## 2017-10-02 NOTE — Assessment & Plan Note (Signed)
BP Readings from Last 3 Encounters:  10/02/17 121/79  09/10/17 (!) 146/89  09/03/17 119/80    Lab Results  Component Value Date   NA 139 09/03/2017   K 4.3 09/03/2017   CREATININE 1.13 (H) 09/03/2017    Assessment: Blood pressure control:  Well-controlled Progress toward BP goal:   At goal Comments: Patient is compliant with losartan/hydrochlorthiazide 50/12.5 mg  Plan: Medications:  continue current medications Educational resources provided:   Self management tools provided:   Other plans: We will check BMP today.

## 2017-10-02 NOTE — Assessment & Plan Note (Signed)
-  Patient had a complex right adnexal mass status post right salpingo-oophorectomy on August 21, 2017.  The procedure was complicated by right ureteral injury resulting in hydronephrosis from severe ureteral obstruction which required placement of a nephrostomy tube. -Patient follow-up with urology on September 25  -Nephrostomy tube is currently in place -Patient noted to have an erythematic's/pruritic rash around the nephrostomy tube site where the tape of the dressing is attached.  I suspect an allergy to the adhesive from the tape.  Will give the patient a different type of tape -No further work-up at this time

## 2017-10-02 NOTE — Assessment & Plan Note (Signed)
Lab Results  Component Value Date   HGBA1C 6.9 (A) 10/02/2017   HGBA1C 7.3 06/05/2017   HGBA1C 8.2 03/06/2017     Assessment: Diabetes control:  Well-controlled Progress toward A1C goal:   At goal Comments: Patient is compliant with metformin 1000 mg twice daily as well as Victoza 1.2 mg daily  Plan: Medications:  continue current medications Home glucose monitoring: Frequency:   Timing:   Instruction/counseling given: discussed the need for weight loss and discussed diet Educational resources provided:   Self management tools provided:   Other plans: We will check BMP today.  Foot exam to be done today

## 2017-10-02 NOTE — Assessment & Plan Note (Signed)
-  This problem is chronic and stable -Patient has been trying to lose weight by watching her diet and exercising -Patient is down approximately 30 pounds over the last year -Patient encouraged to continue to try to lose weight -We will follow-up with this at her next visit

## 2017-10-02 NOTE — Patient Instructions (Signed)
-  It was a pleasure seeing you today -Please follow-up with your urologist this month -We will check some blood work on you today -Your A1c is at goal.  Keep up the great work! -Your blood pressure is well controlled. -Please follow-up with me in 3 months

## 2017-10-02 NOTE — Progress Notes (Signed)
   Subjective:    Patient ID: Traci Butler, female    DOB: 13-Feb-1975, 42 y.o.   MRN: [id number]  HPI  I have seen and examined this patient.  Patient is here for routine follow-up of her hypertension and diabetes.  Patient had a complex right adnexal mass status post right salpingo-oophorectomy on August 21, 2017.  The procedure was complicated by a right ureteral injury resulting in hydronephrosis from severe ureteral obstruction status post nephrostomy tube placement.  Patient states that she feels well currently and has no complaints.  Patient states that she is compliant with all her medications.  Review of Systems  Constitutional: Negative.   HENT: Negative.   Respiratory: Negative.   Cardiovascular: Negative.   Gastrointestinal: Negative.   Genitourinary:       Right nephrostomy tube in place  Musculoskeletal: Negative.   Neurological: Negative.   Psychiatric/Behavioral: Negative.        Objective:   Physical Exam  Constitutional: She is oriented to person, place, and time. She appears well-developed and well-nourished.  HENT:  Head: Normocephalic and atraumatic.  Mouth/Throat: Oropharynx is clear and moist. No oropharyngeal exudate.  Neck: Neck supple.  Cardiovascular: Normal rate, regular rhythm and normal heart sounds.  Pulmonary/Chest: Effort normal and breath sounds normal. No respiratory distress. She has no wheezes. She has no rales.  Abdominal: Soft. Bowel sounds are normal. She exhibits no distension. There is no tenderness.  Right nephrostomy tube in place.  Patient noted to have an erythematous pruritic rash where the tape of the dressing was.  Musculoskeletal: Normal range of motion. She exhibits no edema.  Lymphadenopathy:    She has no cervical adenopathy.  Neurological: She is alert and oriented to person, place, and time.  Psychiatric: She has a normal mood and affect. Her behavior is normal.          Assessment & Plan:  Please see problem based  charting for assessment and plan:

## 2017-10-03 ENCOUNTER — Telehealth: Payer: Self-pay | Admitting: Internal Medicine

## 2017-10-03 ENCOUNTER — Encounter: Payer: Self-pay | Admitting: Internal Medicine

## 2017-10-03 LAB — LIPID PANEL
CHOLESTEROL TOTAL: 113 mg/dL (ref 100–199)
Chol/HDL Ratio: 2.8 ratio (ref 0.0–4.4)
HDL: 40 mg/dL (ref >39)
LDL CALC: 47 mg/dL (ref 0–99)
TRIGLYCERIDES: 129 mg/dL (ref 0–149)
VLDL Cholesterol Cal: 26 mg/dL (ref 5–40)

## 2017-10-03 LAB — BMP8+ANION GAP
ANION GAP: 14 mmol/L (ref 10.0–18.0)
BUN/Creatinine Ratio: 10 (ref 9–23)
BUN: 10 mg/dL (ref 6–24)
CO2: 24 mmol/L (ref 20–29)
CREATININE: 0.97 mg/dL (ref 0.57–1.00)
Calcium: 9.3 mg/dL (ref 8.7–10.2)
Chloride: 100 mmol/L (ref 96–106)
GFR calc Af Amer: 84 mL/min/{1.73_m2} (ref >59)
GFR calc non Af Amer: 73 mL/min/{1.73_m2} (ref >59)
Glucose: 98 mg/dL (ref 65–99)
Potassium: 3.8 mmol/L (ref 3.5–5.2)
SODIUM: 138 mmol/L (ref 134–144)

## 2017-10-03 NOTE — Telephone Encounter (Signed)
I attempted to call the patient to discuss results of her blood work.  Her A1c is at goal.  Her BMP has normalized.  Her cholesterol levels are at goal as well.  I left a quick message on her answering machine letting her know that the blood work was within normal limits.  I am also sending her a letter with the results of her blood work and it.  No further work-up for now.

## 2017-10-09 NOTE — Patient Instructions (Signed)
Traci Butler  10/09/2017   Your procedure is scheduled on: 10-17-17   Report to [facility] Main  Entrance    Report to admitting at 10:00AM    Call this number if you have problems the morning of surgery [phone]     Remember: Do not eat food or drink liquids :After Midnight. BRUSH YOUR TEETH MORNING OF SURGERY AND RINSE YOUR MOUTH OUT, NO CHEWING GUM CANDY OR MINTS.     Take these medicines the morning of surgery with A SIP OF WATER: rosuvastatin    DO NOT TAKE ANY DIABETIC MEDICATIONS DAY OF YOUR SURGERY                               You may not have any metal on your body including hair pins and              piercings  Do not wear jewelry, make-up, lotions, powders or perfumes, deodorant             Do not wear nail polish.  Do not shave  48 hours prior to surgery.               Do not bring valuables to the hospital. [place].  Contacts, dentures or bridgework may not be worn into surgery.  Leave suitcase in the car. After surgery it may be brought to your room.                  Please read over the following fact sheets you were given: _____________________________________________________________________             [facility] - Preparing for Surgery Before surgery, you can play an important role.  Because skin is not sterile, your skin needs to be as free of germs as possible.  You can reduce the number of germs on your skin by washing with CHG (chlorahexidine gluconate) soap before surgery.  CHG is an antiseptic cleaner which kills germs and bonds with the skin to continue killing germs even after washing. Please DO NOT use if you have an allergy to CHG or antibacterial soaps.  If your skin becomes reddened/irritated stop using the CHG and inform your nurse when you arrive at Short Stay. Do not shave (including legs and underarms) for at least 48 hours prior to the first CHG shower.  You  may shave your face/neck. Please follow these instructions carefully:  1.  Shower with CHG Soap the night before surgery and the  morning of Surgery.  2.  If you choose to wash your hair, wash your hair first as usual with your  normal  shampoo.  3.  After you shampoo, rinse your hair and body thoroughly to remove the  shampoo.                           4.  Use CHG as you would any other liquid soap.  You can apply chg directly  to the skin and wash                       Gently with a scrungie or clean washcloth.  5.  Apply the CHG  Soap to your body ONLY FROM THE NECK DOWN.   Do not use on face/ open                           Wound or open sores. Avoid contact with eyes, ears mouth and genitals (private parts).                       Wash face,  Genitals (private parts) with your normal soap.             6.  Wash thoroughly, paying special attention to the area where your surgery  will be performed.  7.  Thoroughly rinse your body with warm water from the neck down.  8.  DO NOT shower/wash with your normal soap after using and rinsing off  the CHG Soap.                9.  Pat yourself dry with a clean towel.            10.  Wear clean pajamas.            11.  Place clean sheets on your bed the night of your first shower and do not  sleep with pets. Day of Surgery : Do not apply any lotions/deodorants the morning of surgery.  Please wear clean clothes to the hospital/surgery center.  FAILURE TO FOLLOW THESE INSTRUCTIONS MAY RESULT IN THE CANCELLATION OF YOUR SURGERY PATIENT SIGNATURE_________________________________  NURSE SIGNATURE__________________________________  ________________________________________________________________________

## 2017-10-09 NOTE — Progress Notes (Signed)
Hgba1c, BMP 10-02-17 epic   EKG 08-15-17 epic

## 2017-10-12 ENCOUNTER — Encounter (HOSPITAL_COMMUNITY): Payer: Self-pay

## 2017-10-12 ENCOUNTER — Other Ambulatory Visit: Payer: Self-pay

## 2017-10-12 ENCOUNTER — Encounter (HOSPITAL_COMMUNITY)
Admission: RE | Admit: 2017-10-12 | Discharge: 2017-10-12 | Disposition: A | Discharge status: Home / Self Care | Payer: BLUE CROSS/BLUE SHIELD | Source: Ambulatory Visit | Attending: Urology | Admitting: Urology

## 2017-10-12 ENCOUNTER — Ambulatory Visit (HOSPITAL_COMMUNITY)
Admission: RE | Admit: 2017-10-12 | Discharge: 2017-10-12 | Disposition: A | Discharge status: Home / Self Care | Payer: BLUE CROSS/BLUE SHIELD | Source: Ambulatory Visit | Attending: Urology | Admitting: Urology

## 2017-10-12 DIAGNOSIS — S3710XD Unspecified injury of ureter, subsequent encounter: Secondary | ICD-10-CM

## 2017-10-12 DIAGNOSIS — Z436 Encounter for attention to other artificial openings of urinary tract: Secondary | ICD-10-CM | POA: Diagnosis present

## 2017-10-12 DIAGNOSIS — S3710XA Unspecified injury of ureter, initial encounter: Secondary | ICD-10-CM | POA: Diagnosis not present

## 2017-10-12 DIAGNOSIS — X58XXXA Exposure to other specified factors, initial encounter: Secondary | ICD-10-CM | POA: Insufficient documentation

## 2017-10-12 HISTORY — PX: IR NEPHROSTOMY EXCHANGE RIGHT: IMG6070

## 2017-10-12 LAB — CBC
HCT: 35.7 % — ABNORMAL LOW (ref 36.0–46.0)
Hemoglobin: 12 g/dL (ref 12.0–15.0)
MCH: 28.2 pg (ref 26.0–34.0)
MCHC: 33.6 g/dL (ref 30.0–36.0)
MCV: 83.8 fL (ref 78.0–100.0)
PLATELETS: 541 10*3/uL — AB (ref 150–400)
RBC: 4.26 MIL/uL (ref 3.87–5.11)
RDW: 14 % (ref 11.5–15.5)
WBC: 8.5 10*3/uL (ref 4.0–10.5)

## 2017-10-12 LAB — HCG, SERUM, QUALITATIVE: Preg, Serum: NEGATIVE

## 2017-10-12 LAB — GLUCOSE, CAPILLARY: Glucose-Capillary: 168 mg/dL — ABNORMAL HIGH (ref 70–99)

## 2017-10-12 MED ORDER — LIDOCAINE HCL 1 % IJ SOLN
INTRAMUSCULAR | Status: AC
  Filled 2017-10-12: qty 20

## 2017-10-12 MED ORDER — LIDOCAINE HCL 1 % IJ SOLN
INTRAMUSCULAR | Status: AC | PRN
  Administered 2017-10-12: 10 mL via INTRADERMAL

## 2017-10-12 MED ORDER — IOPAMIDOL (ISOVUE-300) INJECTION 61%
INTRAVENOUS | Status: AC
  Administered 2017-10-12: 10 mL
  Filled 2017-10-12: qty 50

## 2017-10-12 MED ORDER — IOPAMIDOL (ISOVUE-300) INJECTION 61%
50.0000 mL | Freq: Once | INTRAVENOUS | Status: AC | PRN
  Administered 2017-10-12: 10 mL

## 2017-10-17 ENCOUNTER — Ambulatory Visit (HOSPITAL_COMMUNITY): Payer: BLUE CROSS/BLUE SHIELD | Admitting: Anesthesiology

## 2017-10-17 ENCOUNTER — Encounter (HOSPITAL_COMMUNITY)
Admission: AD | Disposition: A | Discharge status: Home / Self Care | Payer: Self-pay | Source: Ambulatory Visit | Attending: Urology

## 2017-10-17 ENCOUNTER — Encounter (HOSPITAL_COMMUNITY): Payer: Self-pay

## 2017-10-17 ENCOUNTER — Inpatient Hospital Stay (HOSPITAL_COMMUNITY)
Admission: AD | Admit: 2017-10-17 | Discharge: 2017-10-19 | DRG: 655 | Disposition: A | Discharge status: Home / Self Care | Payer: BLUE CROSS/BLUE SHIELD | Source: Ambulatory Visit | Attending: Urology | Admitting: Urology

## 2017-10-17 ENCOUNTER — Other Ambulatory Visit: Payer: Self-pay

## 2017-10-17 DIAGNOSIS — N135 Crossing vessel and stricture of ureter without hydronephrosis: Secondary | ICD-10-CM | POA: Diagnosis present

## 2017-10-17 DIAGNOSIS — I10 Essential (primary) hypertension: Secondary | ICD-10-CM | POA: Diagnosis present

## 2017-10-17 DIAGNOSIS — Z960 Presence of urogenital implants: Secondary | ICD-10-CM | POA: Diagnosis present

## 2017-10-17 DIAGNOSIS — F17210 Nicotine dependence, cigarettes, uncomplicated: Secondary | ICD-10-CM | POA: Diagnosis present

## 2017-10-17 DIAGNOSIS — Z7984 Long term (current) use of oral hypoglycemic drugs: Secondary | ICD-10-CM | POA: Diagnosis not present

## 2017-10-17 DIAGNOSIS — Z90721 Acquired absence of ovaries, unilateral: Secondary | ICD-10-CM

## 2017-10-17 DIAGNOSIS — Z791 Long term (current) use of non-steroidal anti-inflammatories (NSAID): Secondary | ICD-10-CM | POA: Diagnosis not present

## 2017-10-17 DIAGNOSIS — Z6836 Body mass index (BMI) 36.0-36.9, adult: Secondary | ICD-10-CM | POA: Diagnosis not present

## 2017-10-17 DIAGNOSIS — Z79899 Other long term (current) drug therapy: Secondary | ICD-10-CM

## 2017-10-17 DIAGNOSIS — E119 Type 2 diabetes mellitus without complications: Secondary | ICD-10-CM | POA: Diagnosis present

## 2017-10-17 DIAGNOSIS — N9989 Other postprocedural complications and disorders of genitourinary system: Principal | ICD-10-CM | POA: Diagnosis present

## 2017-10-17 DIAGNOSIS — N3289 Other specified disorders of bladder: Secondary | ICD-10-CM | POA: Diagnosis present

## 2017-10-17 DIAGNOSIS — Y838 Other surgical procedures as the cause of abnormal reaction of the patient, or of later complication, without mention of misadventure at the time of the procedure: Secondary | ICD-10-CM | POA: Diagnosis present

## 2017-10-17 DIAGNOSIS — E7800 Pure hypercholesterolemia, unspecified: Secondary | ICD-10-CM | POA: Diagnosis present

## 2017-10-17 DIAGNOSIS — Z79891 Long term (current) use of opiate analgesic: Secondary | ICD-10-CM

## 2017-10-17 LAB — CBC
HEMATOCRIT: 35.5 % — AB (ref 36.0–46.0)
Hemoglobin: 11.7 g/dL — ABNORMAL LOW (ref 12.0–15.0)
MCH: 27.6 pg (ref 26.0–34.0)
MCHC: 33 g/dL (ref 30.0–36.0)
MCV: 83.7 fL (ref 78.0–100.0)
Platelets: 508 10*3/uL — ABNORMAL HIGH (ref 150–400)
RBC: 4.24 MIL/uL (ref 3.87–5.11)
RDW: 13.9 % (ref 11.5–15.5)
WBC: 11.4 10*3/uL — ABNORMAL HIGH (ref 4.0–10.5)

## 2017-10-17 LAB — CREATININE, SERUM
Creatinine, Ser: 1.02 mg/dL — ABNORMAL HIGH (ref 0.44–1.00)
GFR calc Af Amer: >60 mL/min (ref >60)
GFR calc non Af Amer: >60 mL/min (ref >60)

## 2017-10-17 LAB — GLUCOSE, CAPILLARY
Glucose-Capillary: 135 mg/dL — ABNORMAL HIGH (ref 70–99)
Glucose-Capillary: 180 mg/dL — ABNORMAL HIGH (ref 70–99)
Glucose-Capillary: 293 mg/dL — ABNORMAL HIGH (ref 70–99)

## 2017-10-17 SURGERY — REIMPLANTATION, URETER, ROBOT-ASSISTED, LAPAROSCOPIC
Anesthesia: General | Site: Abdomen | Laterality: Right

## 2017-10-17 MED ORDER — HYDROMORPHONE HCL 1 MG/ML IJ SOLN
0.5000 mg | INTRAMUSCULAR | Status: DC | PRN
  Administered 2017-10-17 – 2017-10-19 (×12): 1 mg via INTRAVENOUS
  Filled 2017-10-17 (×12): qty 1

## 2017-10-17 MED ORDER — HYDROMORPHONE HCL 1 MG/ML IJ SOLN
0.2500 mg | INTRAMUSCULAR | Status: DC | PRN
  Administered 2017-10-17: 0.5 mg via INTRAVENOUS

## 2017-10-17 MED ORDER — GABAPENTIN 300 MG PO CAPS
300.0000 mg | ORAL_CAPSULE | Freq: Every day | ORAL | Status: DC
  Administered 2017-10-17 – 2017-10-18 (×2): 300 mg via ORAL
  Filled 2017-10-17 (×3): qty 1

## 2017-10-17 MED ORDER — DEXAMETHASONE SODIUM PHOSPHATE 10 MG/ML IJ SOLN
INTRAMUSCULAR | Status: AC
  Filled 2017-10-17: qty 1

## 2017-10-17 MED ORDER — ROSUVASTATIN CALCIUM 20 MG PO TABS
20.0000 mg | ORAL_TABLET | Freq: Every day | ORAL | Status: DC
  Administered 2017-10-18 – 2017-10-19 (×2): 20 mg via ORAL
  Filled 2017-10-17 (×2): qty 1

## 2017-10-17 MED ORDER — LOSARTAN POTASSIUM 50 MG PO TABS
50.0000 mg | ORAL_TABLET | Freq: Every day | ORAL | Status: DC
  Administered 2017-10-18 – 2017-10-19 (×2): 50 mg via ORAL
  Filled 2017-10-17 (×2): qty 1

## 2017-10-17 MED ORDER — PROPOFOL 10 MG/ML IV BOLUS
INTRAVENOUS | Status: DC | PRN
  Administered 2017-10-17: 200 mg via INTRAVENOUS

## 2017-10-17 MED ORDER — LACTATED RINGERS IV SOLN
INTRAVENOUS | Status: DC
  Administered 2017-10-17 (×2): via INTRAVENOUS

## 2017-10-17 MED ORDER — HYDROCODONE-ACETAMINOPHEN 5-325 MG PO TABS: 1.0000 | ORAL_TABLET | Freq: Four times a day (QID) | ORAL | 0 refills | Status: DC | PRN

## 2017-10-17 MED ORDER — 0.9 % SODIUM CHLORIDE (POUR BTL) OPTIME
TOPICAL | Status: DC | PRN
  Administered 2017-10-17: 1000 mL

## 2017-10-17 MED ORDER — ONDANSETRON HCL 4 MG/2ML IJ SOLN
INTRAMUSCULAR | Status: AC
  Filled 2017-10-17: qty 2

## 2017-10-17 MED ORDER — SODIUM CHLORIDE 0.9 % IJ SOLN
INTRAMUSCULAR | Status: AC
  Filled 2017-10-17: qty 20

## 2017-10-17 MED ORDER — SUGAMMADEX SODIUM 200 MG/2ML IV SOLN
INTRAVENOUS | Status: AC
  Filled 2017-10-17: qty 2

## 2017-10-17 MED ORDER — BUPIVACAINE LIPOSOME 1.3 % IJ SUSP
20.0000 mL | Freq: Once | INTRAMUSCULAR | Status: AC
  Administered 2017-10-17: 20 mL
  Filled 2017-10-17: qty 20

## 2017-10-17 MED ORDER — OXYCODONE HCL 5 MG PO TABS
5.0000 mg | ORAL_TABLET | ORAL | Status: DC | PRN
  Administered 2017-10-17 – 2017-10-19 (×3): 5 mg via ORAL
  Filled 2017-10-17 (×4): qty 1

## 2017-10-17 MED ORDER — ONDANSETRON HCL 4 MG/2ML IJ SOLN
4.0000 mg | INTRAMUSCULAR | Status: DC | PRN
  Administered 2017-10-17 – 2017-10-18 (×2): 4 mg via INTRAVENOUS
  Filled 2017-10-17 (×2): qty 2

## 2017-10-17 MED ORDER — LOSARTAN POTASSIUM-HCTZ 50-12.5 MG PO TABS: 1.0000 | ORAL_TABLET | Freq: Every day | ORAL | Status: DC

## 2017-10-17 MED ORDER — FENTANYL CITRATE (PF) 100 MCG/2ML IJ SOLN
INTRAMUSCULAR | Status: AC
  Filled 2017-10-17: qty 2

## 2017-10-17 MED ORDER — HYDROCHLOROTHIAZIDE 12.5 MG PO CAPS
12.5000 mg | ORAL_CAPSULE | Freq: Every day | ORAL | Status: DC
  Administered 2017-10-18 – 2017-10-19 (×2): 12.5 mg via ORAL
  Filled 2017-10-17 (×2): qty 1

## 2017-10-17 MED ORDER — CEFAZOLIN SODIUM-DEXTROSE 2-4 GM/100ML-% IV SOLN
2.0000 g | Freq: Once | INTRAVENOUS | Status: AC
  Administered 2017-10-17: 2 g via INTRAVENOUS
  Filled 2017-10-17: qty 100

## 2017-10-17 MED ORDER — PROMETHAZINE HCL 25 MG/ML IJ SOLN
6.2500 mg | INTRAMUSCULAR | Status: DC | PRN
  Administered 2017-10-17: 6.25 mg via INTRAVENOUS

## 2017-10-17 MED ORDER — DOCUSATE SODIUM 100 MG PO CAPS: 100.0000 mg | ORAL_CAPSULE | Freq: Two times a day (BID) | ORAL | Status: DC

## 2017-10-17 MED ORDER — HYDROMORPHONE HCL 1 MG/ML IJ SOLN
INTRAMUSCULAR | Status: AC
  Filled 2017-10-17: qty 2

## 2017-10-17 MED ORDER — MIDAZOLAM HCL 2 MG/2ML IJ SOLN: 0.5000 mg | Freq: Once | INTRAMUSCULAR | Status: DC | PRN

## 2017-10-17 MED ORDER — DEXAMETHASONE SODIUM PHOSPHATE 10 MG/ML IJ SOLN
INTRAMUSCULAR | Status: DC | PRN
  Administered 2017-10-17: 10 mg via INTRAVENOUS

## 2017-10-17 MED ORDER — SUGAMMADEX SODIUM 200 MG/2ML IV SOLN
INTRAVENOUS | Status: DC | PRN
  Administered 2017-10-17: 400 mg via INTRAVENOUS

## 2017-10-17 MED ORDER — DIPHENHYDRAMINE HCL 12.5 MG/5ML PO ELIX: 12.5000 mg | ORAL_SOLUTION | Freq: Four times a day (QID) | ORAL | Status: DC | PRN

## 2017-10-17 MED ORDER — ONDANSETRON HCL 4 MG/2ML IJ SOLN
INTRAMUSCULAR | Status: DC | PRN
  Administered 2017-10-17: 4 mg via INTRAVENOUS

## 2017-10-17 MED ORDER — MIDAZOLAM HCL 2 MG/2ML IJ SOLN
INTRAMUSCULAR | Status: DC | PRN
  Administered 2017-10-17: 2 mg via INTRAVENOUS

## 2017-10-17 MED ORDER — HYDROMORPHONE HCL 1 MG/ML IJ SOLN
0.2500 mg | INTRAMUSCULAR | Status: DC | PRN
  Administered 2017-10-17 (×4): 0.5 mg via INTRAVENOUS

## 2017-10-17 MED ORDER — LABETALOL HCL 5 MG/ML IV SOLN
INTRAVENOUS | Status: AC
  Filled 2017-10-17: qty 4

## 2017-10-17 MED ORDER — BUPIVACAINE HCL (PF) 0.5 % IJ SOLN
INTRAMUSCULAR | Status: AC
  Filled 2017-10-17: qty 30

## 2017-10-17 MED ORDER — ROCURONIUM BROMIDE 10 MG/ML (PF) SYRINGE
PREFILLED_SYRINGE | INTRAVENOUS | Status: AC
  Filled 2017-10-17: qty 10

## 2017-10-17 MED ORDER — SODIUM CHLORIDE 0.45 % IV SOLN
INTRAVENOUS | Status: DC
  Administered 2017-10-17 – 2017-10-18 (×2): via INTRAVENOUS

## 2017-10-17 MED ORDER — HEPARIN SODIUM (PORCINE) 5000 UNIT/ML IJ SOLN
5000.0000 [IU] | Freq: Three times a day (TID) | INTRAMUSCULAR | Status: DC
  Administered 2017-10-18 – 2017-10-19 (×4): 5000 [IU] via SUBCUTANEOUS
  Filled 2017-10-17 (×4): qty 1

## 2017-10-17 MED ORDER — LIDOCAINE 2% (20 MG/ML) 5 ML SYRINGE
INTRAMUSCULAR | Status: DC | PRN
  Administered 2017-10-17: 100 mg via INTRAVENOUS

## 2017-10-17 MED ORDER — HYDROMORPHONE HCL 1 MG/ML IJ SOLN
INTRAMUSCULAR | Status: AC
  Filled 2017-10-17: qty 1

## 2017-10-17 MED ORDER — MEPERIDINE HCL 50 MG/ML IJ SOLN: 6.2500 mg | INTRAMUSCULAR | Status: DC | PRN

## 2017-10-17 MED ORDER — PROPOFOL 10 MG/ML IV BOLUS
INTRAVENOUS | Status: AC
  Filled 2017-10-17: qty 20

## 2017-10-17 MED ORDER — CEFAZOLIN SODIUM-DEXTROSE 1-4 GM/50ML-% IV SOLN
1.0000 g | Freq: Three times a day (TID) | INTRAVENOUS | Status: AC
  Administered 2017-10-17 – 2017-10-18 (×2): 1 g via INTRAVENOUS
  Filled 2017-10-17 (×2): qty 50

## 2017-10-17 MED ORDER — ONDANSETRON HCL 4 MG PO TABS: 4.0000 mg | ORAL_TABLET | Freq: Three times a day (TID) | ORAL | 0 refills | Status: DC | PRN

## 2017-10-17 MED ORDER — DIPHENHYDRAMINE HCL 50 MG/ML IJ SOLN
12.5000 mg | Freq: Four times a day (QID) | INTRAMUSCULAR | Status: DC | PRN
  Administered 2017-10-18 – 2017-10-19 (×3): 25 mg via INTRAVENOUS
  Filled 2017-10-17 (×3): qty 1

## 2017-10-17 MED ORDER — BUPIVACAINE-EPINEPHRINE (PF) 0.25% -1:200000 IJ SOLN
INTRAMUSCULAR | Status: AC
  Filled 2017-10-17: qty 30

## 2017-10-17 MED ORDER — LIDOCAINE 2% (20 MG/ML) 5 ML SYRINGE
INTRAMUSCULAR | Status: AC
  Filled 2017-10-17: qty 5

## 2017-10-17 MED ORDER — MIDAZOLAM HCL 2 MG/2ML IJ SOLN
INTRAMUSCULAR | Status: AC
  Filled 2017-10-17: qty 2

## 2017-10-17 MED ORDER — ACETAMINOPHEN 500 MG PO TABS
1000.0000 mg | ORAL_TABLET | Freq: Four times a day (QID) | ORAL | Status: AC
  Administered 2017-10-17 – 2017-10-18 (×4): 1000 mg via ORAL
  Filled 2017-10-17 (×4): qty 2

## 2017-10-17 MED ORDER — INSULIN ASPART 100 UNIT/ML ~~LOC~~ SOLN
0.0000 [IU] | Freq: Three times a day (TID) | SUBCUTANEOUS | Status: DC
  Administered 2017-10-17: 3 [IU] via SUBCUTANEOUS

## 2017-10-17 MED ORDER — FENTANYL CITRATE (PF) 100 MCG/2ML IJ SOLN
INTRAMUSCULAR | Status: DC | PRN
  Administered 2017-10-17 (×8): 50 ug via INTRAVENOUS

## 2017-10-17 MED ORDER — SODIUM CHLORIDE 0.9 % IJ SOLN
INTRAMUSCULAR | Status: DC | PRN
  Administered 2017-10-17: 10 mL

## 2017-10-17 MED ORDER — ROCURONIUM BROMIDE 10 MG/ML (PF) SYRINGE
PREFILLED_SYRINGE | INTRAVENOUS | Status: DC | PRN
  Administered 2017-10-17: 10 mg via INTRAVENOUS
  Administered 2017-10-17: 5 mg via INTRAVENOUS
  Administered 2017-10-17: 50 mg via INTRAVENOUS
  Administered 2017-10-17: 10 mg via INTRAVENOUS

## 2017-10-17 SURGICAL SUPPLY — 72 items
APL SWBSTK 6 STRL LF DISP (MISCELLANEOUS) ×2
APPLICATOR COTTON TIP 6 STRL (MISCELLANEOUS) ×2 IMPLANT
APPLICATOR COTTON TIP 6IN STRL (MISCELLANEOUS) ×3
BAG URO CATCHER STRL LF (MISCELLANEOUS) ×1 IMPLANT
CATH FOLEY 2WAY SLVR  5CC 20FR (CATHETERS)
CATH FOLEY 2WAY SLVR 18FR 30CC (CATHETERS) ×2 IMPLANT
CATH FOLEY 2WAY SLVR 5CC 20FR (CATHETERS) ×2 IMPLANT
CATH ROBINSON RED A/P 8FR (CATHETERS) ×1 IMPLANT
CATH URET 5FR 28IN OPEN ENDED (CATHETERS) ×3 IMPLANT
CHLORAPREP W/TINT 26ML (MISCELLANEOUS) ×3 IMPLANT
CLIP VESOLOCK LG 6/CT PURPLE (CLIP) ×2 IMPLANT
CLIP VESOLOCK MED LG 6/CT (CLIP) ×2 IMPLANT
CLOTH BEACON ORANGE TIMEOUT ST (SAFETY) ×1 IMPLANT
COVER SURGICAL LIGHT HANDLE (MISCELLANEOUS) ×3 IMPLANT
COVER TIP SHEARS 8 DVNC (MISCELLANEOUS) ×2 IMPLANT
COVER TIP SHEARS 8MM DA VINCI (MISCELLANEOUS) ×1
DECANTER SPIKE VIAL GLASS SM (MISCELLANEOUS) ×3 IMPLANT
DRAIN CHANNEL 10M FLAT 3/4 FLT (DRAIN) ×2 IMPLANT
DRAPE ARM DVNC X/XI (DISPOSABLE) ×8 IMPLANT
DRAPE COLUMN DVNC XI (DISPOSABLE) ×2 IMPLANT
DRAPE DA VINCI XI ARM (DISPOSABLE) ×4
DRAPE DA VINCI XI COLUMN (DISPOSABLE) ×1
DRAPE SURG IRRIG POUCH 19X23 (DRAPES) ×3 IMPLANT
DRSG TEGADERM 2-3/8X2-3/4 SM (GAUZE/BANDAGES/DRESSINGS) ×5 IMPLANT
DRSG TEGADERM 4X4.75 (GAUZE/BANDAGES/DRESSINGS) ×3 IMPLANT
ELECT PENCIL ROCKER SW 15FT (MISCELLANEOUS) ×2 IMPLANT
ELECT REM PT RETURN 15FT ADLT (MISCELLANEOUS) ×3 IMPLANT
GAUZE SPONGE 4X4 16PLY XRAY LF (GAUZE/BANDAGES/DRESSINGS) ×2 IMPLANT
GLOVE BIO SURGEON STRL SZ 6.5 (GLOVE) ×3 IMPLANT
GLOVE BIOGEL M STRL SZ7.5 (GLOVE) ×6 IMPLANT
GOWN STRL REUS W/TWL LRG LVL3 (GOWN DISPOSABLE) ×9 IMPLANT
GUIDEWIRE STR DUAL SENSOR (WIRE) ×1 IMPLANT
GUIDEWIRE ZIPWRE .038 STRAIGHT (WIRE) ×2 IMPLANT
HOLDER FOLEY CATH W/STRAP (MISCELLANEOUS) ×3 IMPLANT
IRRIG SUCT STRYKERFLOW 2 WTIP (MISCELLANEOUS) ×3
IRRIGATION SUCT STRKRFLW 2 WTP (MISCELLANEOUS) ×2 IMPLANT
IV LACTATED RINGERS 1000ML (IV SOLUTION) ×2 IMPLANT
LOOP VESSEL MAXI BLUE (MISCELLANEOUS) ×2 IMPLANT
MANIFOLD NEPTUNE II (INSTRUMENTS) ×3 IMPLANT
NDL INSUFFLATION 14GA 120MM (NEEDLE) ×1 IMPLANT
NDL SPNL 22GX3.5 QUINCKE BK (NEEDLE) IMPLANT
NEEDLE INSUFFLATION 14GA 120MM (NEEDLE) ×3 IMPLANT
NEEDLE SPNL 22GX3.5 QUINCKE BK (NEEDLE) ×3 IMPLANT
PACK CYSTO (CUSTOM PROCEDURE TRAY) ×1 IMPLANT
PACK ROBOT UROLOGY CUSTOM (CUSTOM PROCEDURE TRAY) ×3 IMPLANT
PAD POSITIONING PINK XL (MISCELLANEOUS) ×3 IMPLANT
PORT ACCESS TROCAR AIRSEAL 12 (TROCAR) ×1 IMPLANT
PORT ACCESS TROCAR AIRSEAL 5M (TROCAR) ×1
SEAL CANN UNIV 5-8 DVNC XI (MISCELLANEOUS) ×8 IMPLANT
SEAL XI 5MM-8MM UNIVERSAL (MISCELLANEOUS) ×4
SET TRI-LUMEN FLTR TB AIRSEAL (TUBING) ×2 IMPLANT
SOLUTION ELECTROLUBE (MISCELLANEOUS) ×3 IMPLANT
STAPLER VISISTAT 35W (STAPLE) ×1 IMPLANT
STENT URET 6FRX24 CONTOUR (STENTS) ×2 IMPLANT
SUT ETHILON 2 0 PSLX (SUTURE) ×3 IMPLANT
SUT MNCRL 3 0 RB1 (SUTURE) ×1 IMPLANT
SUT MNCRL 3 0 VIOLET RB1 (SUTURE) ×1 IMPLANT
SUT MNCRL AB 4-0 PS2 18 (SUTURE) ×4 IMPLANT
SUT MONOCRYL 3 0 RB1 (SUTURE)
SUT V-LOC BARB 180 2/0GR6 GS22 (SUTURE) ×3
SUT VIC AB 0 CT1 27 (SUTURE) ×3
SUT VIC AB 0 CT1 27XBRD ANTBC (SUTURE) ×2 IMPLANT
SUT VIC AB 2-0 SH 27 (SUTURE) ×3
SUT VIC AB 2-0 SH 27X BRD (SUTURE) ×1 IMPLANT
SUT VIC AB 4-0 RB1 27 (SUTURE) ×18
SUT VIC AB 4-0 RB1 27XBRD (SUTURE) ×6 IMPLANT
SUT VICRYL 0 UR6 27IN ABS (SUTURE) ×4 IMPLANT
SUTURE V-LC BRB 180 2/0GR6GS22 (SUTURE) ×1 IMPLANT
SYRINGE IRR TOOMEY STRL 70CC (SYRINGE) ×3 IMPLANT
TOWEL OR NON WOVEN STRL DISP B (DISPOSABLE) ×3 IMPLANT
TUBING CONNECTING 10 (TUBING) ×1 IMPLANT
WATER STERILE IRR 1000ML POUR (IV SOLUTION) ×3 IMPLANT

## 2017-10-17 NOTE — Anesthesia Preprocedure Evaluation (Addendum)
Anesthesia Evaluation  Patient identified by MRN, date of birth, ID band Patient awake    Reviewed: Allergy & Precautions, NPO status , Patient's Chart, lab work & pertinent test results  History of Anesthesia Complications Negative for: history of anesthetic complications  Airway Mallampati: I  TM Distance: >3 FB Neck ROM: Full    Dental  (+) Chipped, Dental Advisory Given   Pulmonary Current Smoker,    breath sounds clear to auscultation       Cardiovascular hypertension, Pt. on medications (-) angina Rhythm:Regular Rate:Normal     Neuro/Psych negative neurological ROS     GI/Hepatic negative GI ROS, Neg liver ROS,   Endo/Other  diabetes (glu 168), Insulin Dependent, Oral Hypoglycemic AgentsMorbid obesity  Renal/GU      Musculoskeletal   Abdominal (+) + obese,   Peds  Hematology   Anesthesia Other Findings   Reproductive/Obstetrics                            Anesthesia Physical Anesthesia Plan  ASA: III  Anesthesia Plan: General   Post-op Pain Management:    Induction: Intravenous  PONV Risk Score and Plan: 2 and Ondansetron and Dexamethasone  Airway Management Planned: Oral ETT  Additional Equipment:   Intra-op Plan:   Post-operative Plan: Extubation in OR  Informed Consent: I have reviewed the patients History and Physical, chart, labs and discussed the procedure including the risks, benefits and alternatives for the proposed anesthesia with the patient or authorized representative who has indicated his/her understanding and acceptance.   Dental advisory given  Plan Discussed with: CRNA and Surgeon  Anesthesia Plan Comments: (Plan routine monitors, GETA)        Anesthesia Quick Evaluation

## 2017-10-17 NOTE — H&P (Signed)
Urology Preoperative H&P   Chief Complaint: Right ureteral obstruction   History of Present Illness: Traci Butler is a 42 y.o. female with a history of a right ureteral injury during a right salpingo-oophorectomy for an ovarian cyst (path benign) on 08/21/17. She was seen in consultation by Dr. Jeffie Pollock on 09/01/17 for worsening right flank pain and eventually underwent a cystoscopy with attempted right JJ stent placement. During her cystoscopy, retrograde access to the proximal aspects of the right ureter could not be obtained and her fluoroscopic images revealed a complete occlusion of the distal/pelvic portion of her right ureter. She then had a right PCN placed without complication by Dr. Kathlene Cote.  Past Medical History:  Diagnosis Date  . Diabetes mellitus without complication ([place])   . High cholesterol   . Hypertension     Past Surgical History:  Procedure Laterality Date  . APPENDECTOMY  2008  . CESAREAN SECTION  2005   live birth  . CESAREAN SECTION  2011   live birth  . CYSTOSCOPY N/A 08/21/2017   Procedure: CYSTOSCOPY;  Surgeon: Mora Bellman, MD;  Location: [facility];  Service: Gynecology;  Laterality: N/A;  . CYSTOSCOPY W/ URETERAL STENT PLACEMENT Right 09/01/2017   Procedure: CYSTOSCOPY WITH RETROGRADE PYELOGRAM/URETEROSCOPY;  Surgeon: Irine Seal, MD;  Location: [facility];  Service: Urology;  Laterality: Right;  . IR NEPHROSTOMY EXCHANGE RIGHT  10/12/2017  . IR NEPHROSTOMY PLACEMENT RIGHT  09/01/2017  . LAPAROSCOPIC SALPINGO OOPHERECTOMY Right 08/21/2017   Procedure: LAPAROSCOPIC RIGHT SALPINGO OOPHORECTOMY;  Surgeon: Mora Bellman, MD;  Location: [facility];  Service: Gynecology;  Laterality: Right;  . TONSILLECTOMY    . WISDOM TOOTH EXTRACTION  2015    Allergies: No Known Allergies  Family History  Problem Relation Age of Onset  . Diabetes Mother   . Hypertension Father     Social History:  reports that she has been smoking. She has a 12.50 pack-year smoking history. She  has never used smokeless tobacco. She reports that she drinks about 2.0 standard drinks of alcohol per week. She reports that she does not use drugs.  ROS: A complete review of systems was performed.  All systems are negative except for pertinent findings as noted.  Physical Exam:  Vital signs in last 24 hours: Temp:  [98.1 F (36.7 C)] 98.1 F (36.7 C) (09/25 0952) Pulse Rate:  [85] 85 (09/25 0952) Resp:  [16] 16 (09/25 0952) BP: (129)/(92) 129/92 (09/25 0952) SpO2:  [98 %] 98 % (09/25 0952) Weight:  [103.4 kg] 103.4 kg (09/25 1023) Constitutional:  Alert and oriented, No acute distress Cardiovascular: Regular rate and rhythm, No JVD Respiratory: Normal respiratory effort, Lungs clear bilaterally GI: Abdomen is soft, nontender, nondistended, no abdominal masses GU: No CVA tenderness Lymphatic: No lymphadenopathy Neurologic: Grossly intact, no focal deficits Psychiatric: Normal mood and affect  Laboratory Data:  No results for input(s): WBC, HGB, HCT, PLT in the last 72 hours.  No results for input(s): NA, K, CL, GLUCOSE, BUN, CALCIUM, CREATININE in the last 72 hours.  Invalid input(s): CO3   No results found for this or any previous visit (from the past 24 hour(s)). No results found for this or any previous visit (from the past 240 hour(s)).  Renal Function: No results for input(s): CREATININE in the last 168 hours. Estimated Creatinine Clearance: 92.7 mL/min (by C-G formula based on SCr of 0.97 mg/dL).  Radiologic Imaging: No results found.  I independently reviewed the above imaging studies.  Assessment and Plan Traci Butler  is a 42 y.o. female with a right ureteral obstruction secondary to injury during a right salpingo-oophorectomy   -The risks, benefits and alternatives of robotic-assisted laparoscopic right ureteral reimplantation was discussed with the patient. We discussed the potential need for open conversion due to abdominal adhesions from her prior  surgeries or from intra-operative complications. I described, in detail, the psoas hitch and the Boari flap as well as the scenarios in which those surgical procedures would be necessary. The risks include, but are not limited to bleeding, urinary tract infection, intrabdominal infection, ureteral stricture disease. right kidney loss, decline in right renal function due to surgical complications, urine leak from the ureteral anastomosis, ureteral necrosis, the need for ureteral stent placement, the need for Foley catheter placement post-operatively, chronic pain, MI, CVA, DVT/PE and the inherent risks with general anesthesia. She voices understanding and wishes to proceed.  [name], MD 10/17/2017, 11:17 AM  [facility] Pager: [phone]

## 2017-10-17 NOTE — Progress Notes (Signed)
RN notified [facility] answering service to contact the on-call PCP to ask for an order to advance diet to regular/carbohydrate modified diet.  Awaiting a call back from on-call PCP

## 2017-10-17 NOTE — Op Note (Addendum)
Operative Note  Preoperative diagnosis:  1.  Obstructed right distal ureter secondary to ureteral injury during right oophorectomy on 08/21/17.  Postoperative diagnosis: 1.  Same  Procedure(s): 1.  Robotic assisted laparoscopic right robotic ureteral reimplant with psoas hitch  Surgeon: [name], MD  Assistants:  Debbrah Alar, [facility]. An assistant was required for this surgical procedure.  The duties of the assistant included but were not limited to suctioning, passing suture, camera manipulation, retraction.  This procedure would not be able to be performed without an Environmental consultant.   Anesthesia:  General  Complications:  None  EBL:  100 mL  Fluids: 1200 mL   Specimens: 1. Right distal ureter  Drains/Catheters: 1.  18 French Foley catheter 2.  Right 6 French by 24 cm JJ stent without tether 3.  Left lower quadrant JP drain  Intraoperative findings:   1. The right ureter was reimplanted in a tension-free manner with no evidence of urine leak after the bladder was filled with approximately 300 mL  Indication:  Traci Butler is a 42 y.o. female with history of a right ureteral injury during a right salpingo-oophorectomy for an ovarian cyst (path benign) on 08/21/17. She was seen in consultation by Dr. Jeffie Pollock on 09/01/17 for worsening right flank pain and eventually underwent a cystoscopy with attempted right JJ stent placement. During her cystoscopy, retrograde access to the proximal aspects of the right ureter could not be obtained and her fluoroscopic images revealed a complete occlusion of the distal/pelvic portion of her right ureter. She then had a right PCN placed without complication by Dr. Kathlene Cote.  She has been consented for the above procedures, voices understanding and wishes to proceed.  Description of procedure:  After informed consent was obtained and signed, the patient was brought back to the operating room and placed on the operating room table in the supine  position. General endotracheal anesthesia was induced.  The patient appropriately padded and was converted to lithotomy position. His abdomen, genitals, and perineum were widely prepped and draped into a sterile field. An 7 French Foley catheter was then placed sterilely with drainage of clear urine observed.  A suprapubic 8 mm vertical incision was made. The Veress needle was then inserted into the peritoneal cavity.  Saline drop test of the Veress needle revealed no signs of obstruction and aspiration of the needle revealed no blood or sucus.  The abdomen was then insufflated with CO2 gas to a maximum pressure of 15 mmHg. The Veress needle was removed, and an 8 mm trocar was placed atraumatically.  The robotic camera was used to inspect the sight of entry into the abdominal cavity and revealed no signs of adjacent organ or vessel injury.  Under direct laparoscopic vision, three additional 8 mm trochars and one 12 mm trocar was placed at  the level of the umbilicus, in a standard sawtooth configuration.  An additional 5 mm port was placed superior and between the ports in the right lower quadrant The patient was then placed in steep Trendelenburg position and the robot was docked.  The patient's ascending colon was moderately adherent to the lateral sidewall. This was taken down with a combination of sharp and blunt dissection.  Once the ascending colon was reflected medially, the right ureter was identified as it crossed over the psoas muscle and traced down to the pelvis where crossed over the right iliac vessels.  There was dense adhesions and scar tissue approximately 3 cm distal to the pelvic brim, likely representing  the site of trauma from her right oophorectomy.  The distal aspects of the right ureter or ligated with hemo-lock clips and then the more proximal portion of the ureter was transected sharply.  The space of Retzius was then developed and the bladder was dropped.  The right lateral wing of  the bladder was unable to reach the ureter and into tension-free manner, so I performed a psoas hitch with a 3 OV lock suture through the psoas minor tendon onto the posterior aspects of the bladder.  This allowed an additional 3-4 cm of length and the ureter was able to reach the bladder dome and a tension-free manner.  A cystotomy was then made along the right side of the bladder dome.  The distal aspects of the right ureter was then spatulated widely.  The ureter was anastomosed to the cystotomy with interrupted 4-0 Vicryls.  Approximate halfway through the anastomosis, a Glidewire was advanced in a retrograde fashion up the right ureter and a 6 [place] by 24 cm JJ stent was advanced over the wire and into the right collecting system.  The distal curl of the stent was tucked into the bladder.  The remainder of the anastomosis was then completed with interrupted 4-0 Vicryl sutures.  I then tested the integrity of the anastomosis with approximately 250 mL of saline and there was no evidence of a urine leak.  The perivesical tissue was then reapproximated over the anastomosis with a running 2-0 Vicryl suture.  A 10 mm flat JP drain was then placed through the left lower quadrant robotic port incision placed adjacent to the anastomosis.  Robotic ports were then removed.  Her incisions were then anesthetized with Exparel.  Her skin incisions were then closed with running 4-0 Monocryl and dressed with Dermabond.  The patient was then awoken from anesthesia having tolerated the procedure well.  She was transferred to the postanesthesia unit in stable condition.   Plan: Keep Foley and JP drain in place.  Encourage early ambulation.  She will go home with her Foley catheter with plans to follow-up in approximately 10 days for a cystogram.

## 2017-10-17 NOTE — Transfer of Care (Signed)
Immediate Anesthesia Transfer of Care Note  Patient: Traci Butler  Procedure(s) Performed: XI ROBOTICALLY ASSISTED LAPAROSCOPIC URETERAL RE-IMPLANTATION/RIGHT URETERAL STENT PLACEMENT (Right Abdomen)  Patient Location: PACU  Anesthesia Type:General  Level of Consciousness: awake, drowsy and patient cooperative  Airway & Oxygen Therapy: Patient Spontanous Breathing and Patient connected to face mask oxygen  Post-op Assessment: Report given to RN and Post -op Vital signs reviewed and stable  Post vital signs: Reviewed and stable  Last Vitals:  Vitals Value Taken Time  BP    Temp    Pulse    Resp    SpO2      Last Pain:  Vitals:   10/17/17 0952  TempSrc: Oral         Complications: No apparent anesthesia complications

## 2017-10-17 NOTE — Anesthesia Procedure Notes (Signed)
Procedure Name: Intubation Date/Time: 10/17/2017 12:20 PM Performed by: Dione Booze, CRNA Pre-anesthesia Checklist: Suction available, Patient being monitored, Emergency Drugs available and Patient identified Patient Re-evaluated:Patient Re-evaluated prior to induction Oxygen Delivery Method: Circle system utilized Preoxygenation: Pre-oxygenation with 100% oxygen Induction Type: IV induction Ventilation: Mask ventilation without difficulty Laryngoscope Size: Mac and 4 Grade View: Grade I Tube type: Oral Tube size: 7.5 mm Number of attempts: 1 Airway Equipment and Method: Stylet Placement Confirmation: ETT inserted through vocal cords under direct vision,  positive ETCO2 and breath sounds checked- equal and bilateral Secured at: 22 cm Tube secured with: Tape Dental Injury: Teeth and Oropharynx as per pre-operative assessment

## 2017-10-17 NOTE — Discharge Instructions (Signed)
1. Activity:  You are encouraged to ambulate frequently (about every hour during waking hours) to help prevent blood clots from forming in your legs or lungs.  However, you should not engage in any heavy lifting (> 10-15 lbs), strenuous activity, or straining. 2. Diet: You should continue a clear liquid diet until passing gas from below.  Once this occurs, you may advance your diet to a soft diet that would be easy to digest (i.e soups, scrambled eggs, mashed potatoes, etc.) for 24 hours just as you would if getting over a bad stomach flu.  If tolerating this diet well for 24 hours, you may then begin eating regular food.  It will be normal to have some amount of bloating, nausea, and abdominal discomfort intermittently. 3. Prescriptions:  You will be provided a prescription for pain medication to take as needed.  If your pain is not severe enough to require the prescription pain medication, you may take Tylenol instead.  You should also take an over the counter stool softener (Colace 100 mg twice daily) to avoid straining with bowel movements as the pain medication may constipate you.  4. Catheter care: You will be taught how to take care of the catheter by the nursing staff prior to discharge from the hospital.  You may use both a leg bag and the larger bedside bag but it is recommended to at least use the bigger bedside bag at nighttime as the leg bag is small and will fill up overnight and also does not drain as well when lying flat. You may periodically feel a strong urge to void with the catheter in place.  This is a bladder spasm and most often can occur when having a bowel movement or when you are moving around. It is typically self-limited and usually will stop after a few minutes.   5. Incisions: You may remove your dressing bandages the 2nd day after surgery.  You most likely will have a few small staples in each of the incisions and once the bandages are removed, the incisions may stay open to air.   You may start showering (not soaking or bathing in water) 48 hours after surgery and the incisions simply need to be patted dry after the shower.  No additional care is needed. 6. What to call us about: You should call the office [phone]) if you develop fever > 101, persistent vomiting, or the catheter stops draining. Also, feel free to call with any other questions you may have. 7. You may resume aspirin, advil, aleve, vitamins, and supplements 7 days after surgery.

## 2017-10-17 NOTE — Anesthesia Postprocedure Evaluation (Signed)
Anesthesia Post Note  Patient: Traci Butler  Procedure(s) Performed: XI ROBOTICALLY ASSISTED LAPAROSCOPIC URETERAL RE-IMPLANTATION/RIGHT URETERAL STENT PLACEMENT (Right Abdomen)     Patient location during evaluation: PACU Anesthesia Type: General Level of consciousness: awake and alert, oriented and patient cooperative Pain management: pain level controlled (pain improving) Vital Signs Assessment: post-procedure vital signs reviewed and stable Respiratory status: spontaneous breathing, nonlabored ventilation, respiratory function stable and patient connected to nasal cannula oxygen Cardiovascular status: blood pressure returned to baseline and stable Postop Assessment: no apparent nausea or vomiting Anesthetic complications: no    Last Vitals:  Vitals:   10/17/17 1630 10/17/17 1724  BP: (!) 167/103 (!) 145/93  Pulse: 82 80  Resp: 20 16  Temp: 36.7 C 37.3 C  SpO2: 100% 100%    Last Pain:  Vitals:   10/17/17 1744  TempSrc:   PainSc: 8                  Evellyn Tuff,E. Anahis Furgeson

## 2017-10-18 LAB — BASIC METABOLIC PANEL
Anion gap: 10 (ref 5–15)
BUN: 15 mg/dL (ref 6–20)
CALCIUM: 8.3 mg/dL — AB (ref 8.9–10.3)
CO2: 24 mmol/L (ref 22–32)
CREATININE: 1 mg/dL (ref 0.44–1.00)
Chloride: 99 mmol/L (ref 98–111)
Glucose, Bld: 308 mg/dL — ABNORMAL HIGH (ref 70–99)
Potassium: 3.8 mmol/L (ref 3.5–5.1)
SODIUM: 133 mmol/L — AB (ref 135–145)

## 2017-10-18 LAB — HEMOGLOBIN AND HEMATOCRIT, BLOOD
HCT: 30.8 % — ABNORMAL LOW (ref 36.0–46.0)
HEMOGLOBIN: 10.2 g/dL — AB (ref 12.0–15.0)

## 2017-10-18 LAB — GLUCOSE, CAPILLARY
GLUCOSE-CAPILLARY: 226 mg/dL — AB (ref 70–99)
GLUCOSE-CAPILLARY: 138 mg/dL — AB (ref 70–99)
GLUCOSE-CAPILLARY: 228 mg/dL — AB (ref 70–99)
GLUCOSE-CAPILLARY: 189 mg/dL — AB (ref 70–99)

## 2017-10-18 MED ORDER — BELLADONNA ALKALOIDS-OPIUM 16.2-60 MG RE SUPP: 1.0000 | Freq: Three times a day (TID) | RECTAL | Status: DC | PRN

## 2017-10-18 MED ORDER — ACETAMINOPHEN 500 MG PO TABS
1000.0000 mg | ORAL_TABLET | Freq: Four times a day (QID) | ORAL | Status: DC
  Administered 2017-10-18 – 2017-10-19 (×3): 1000 mg via ORAL
  Filled 2017-10-18 (×3): qty 2

## 2017-10-18 MED ORDER — OXYBUTYNIN CHLORIDE 5 MG PO TABS
5.0000 mg | ORAL_TABLET | Freq: Three times a day (TID) | ORAL | Status: DC | PRN
  Administered 2017-10-18 – 2017-10-19 (×3): 5 mg via ORAL
  Filled 2017-10-18 (×3): qty 1

## 2017-10-18 MED ORDER — INSULIN ASPART 100 UNIT/ML ~~LOC~~ SOLN
0.0000 [IU] | Freq: Three times a day (TID) | SUBCUTANEOUS | Status: DC
  Administered 2017-10-18: 2 [IU] via SUBCUTANEOUS
  Administered 2017-10-18 (×2): 5 [IU] via SUBCUTANEOUS
  Administered 2017-10-19: 2 [IU] via SUBCUTANEOUS

## 2017-10-18 NOTE — Progress Notes (Addendum)
Post-op note  Subjective: The patient is doing well.  No complaints except mild bladder spasms.  Denies N/V.  Passed flatus.  Amb x 2 last night.  "I think I need to stay one more day".  Objective: Vital signs in last 24 hours: Temp:  [98 F (36.7 C)-99.2 F (37.3 C)] 98 F (36.7 C) (09/26 0628) Pulse Rate:  [74-85] 74 (09/26 0628) Resp:  [14-20] 16 (09/26 0628) BP: (112-167)/(67-103) 115/67 (09/26 0628) SpO2:  [98 %-100 %] 100 % (09/26 0628) Weight:  [103.4 kg] 103.4 kg (09/25 1023)  Intake/Output from previous day: 09/25 0701 - 09/26 0700 In: 0017 [P.O.:1440; I.V.:2681; IV Piggyback:200] Out: 2350 [Urine:2150; Drains:100; Blood:100] Intake/Output this shift: No intake/output data recorded.  Physical Exam:  General: Alert and oriented. Abdomen: Soft, Nondistended. Incisions: Clean and dry. JP in place with minimal serosang drainage around site and in bulb Urine: clear  Lab Results: Recent Labs    10/17/17 1703 10/18/17 0403  HGB 11.7* 10.2*  HCT 35.5* 30.8*    Assessment/Plan: POD#0   1) Continue to monitor  2) DVT prophy, IS, amb, pain control  3) Adv diet to softs only for now  4) B/O prn spasms  5) D/c JP  6) Plan possible d/c home tomorrow   LOS: 1 day   Debbrah Alar 10/18/2017, 7:10 AM   I agree with the assessment and plan as outlined above.  Follow-up on 10/26/17 for catheter removal.  [name], MD

## 2017-10-19 LAB — GLUCOSE, CAPILLARY: Glucose-Capillary: 150 mg/dL — ABNORMAL HIGH (ref 70–99)

## 2017-10-19 LAB — HEMOGLOBIN AND HEMATOCRIT, BLOOD
HCT: 31 % — ABNORMAL LOW (ref 36.0–46.0)
HEMOGLOBIN: 10.1 g/dL — AB (ref 12.0–15.0)

## 2017-10-19 MED ORDER — OXYBUTYNIN CHLORIDE 5 MG PO TABS: 5.0000 mg | ORAL_TABLET | Freq: Three times a day (TID) | ORAL | 0 refills | Status: DC | PRN

## 2017-10-19 MED ORDER — BISACODYL 10 MG RE SUPP
10.0000 mg | Freq: Once | RECTAL | Status: DC
  Filled 2017-10-19: qty 1

## 2017-10-19 NOTE — Progress Notes (Signed)
Post-op note  Subjective: The patient is doing well.  No complaints.  Bladder spasms improved with ditropan.  Amb and passing flatus.  Tolerating reg diet.   Objective: Vital signs in last 24 hours: Temp:  [98 F (36.7 C)-98.6 F (37 C)] 98.6 F (37 C) (09/27 0426) Pulse Rate:  [73-79] 79 (09/27 0426) Resp:  [16-18] 18 (09/27 0426) BP: (102-138)/(69-80) 102/80 (09/27 0426) SpO2:  [96 %-99 %] 96 % (09/27 0426)  Intake/Output from previous day: 09/26 0701 - 09/27 0700 In: 1605 [P.O.:480; I.V.:1125] Out: 4485 [ZJQBH:4193; Drains:10] Intake/Output this shift: No intake/output data recorded.  Physical Exam:  General: Alert and oriented. Abdomen: Soft, Nondistended. Incisions: Clean and dry. Urine: clear  Lab Results: Recent Labs    10/17/17 1703 10/18/17 0403 10/19/17 0440  HGB 11.7* 10.2* 10.1*  HCT 35.5* 30.8* 31.0*    Assessment/Plan: POD#2  1) Doing well and ready for d/c home  2) will give dulcolax supp prior to d/c     LOS: 2 days   Debbrah Alar 10/19/2017, 11:02 AM

## 2017-10-23 NOTE — Discharge Summary (Signed)
Date of admission: 10/17/2017  Date of discharge: 10/19/2017  Admission diagnosis: Iatrogenic right ureteral obstruction following right salpingo-oophorectomy    Discharge diagnosis: Same  Procedures: Robotic assisted right ureteral reimplant with psoas hitch   History and Physical: For full details, please see admission history and physical. Briefly, Traci Butler is a 42 y.o. year old patient with an iatrogenic right distal ureteral obstruction following a right salpingo-oophorectomy on 08/21/17.  She is s/p Robotic assisted right ureteral reimplant with psoas hitch.   Hospital Course: The patient was monitored on the floor postoperatively.  On postoperative day 1, her JP drain had minimal output and was removed.  Her Foley catheter drained clear to she'll urine during her entire hospital stay.  By postop day 2, the patient was ambulating, tolerating a regular diet and her pain was controlled.  She was discharged home with her Foley catheter with plans for a cystogram and likely catheter removal on 10/26/2017.  Physical Exam:  General: Alert and oriented CV: RRR, palpable distal pulses Lungs: CTAB, equal chest rise Abdomen: Soft, NTND, no rebound or guarding Incisions: c/d/i. GU: Foley catheter is draining clear-yellow urine Ext: NT, No erythema  Laboratory values: No results for input(s): HGB, HCT in the last 72 hours. No results for input(s): CREATININE in the last 72 hours.  Disposition: Home  Discharge instruction: The patient was instructed to be ambulatory but told to refrain from heavy lifting, strenuous activity, or driving.  Discharge medications:  Allergies as of 10/19/2017   No Known Allergies     Medication List    STOP taking these medications   cephALEXin 500 MG capsule Commonly known as:  KEFLEX   ibuprofen 600 MG tablet Commonly known as:  ADVIL,MOTRIN     TAKE these medications   BAYER MICROLET LANCETS lancets Use as instructed two times daily    CONTOUR NEXT ONE Kit 1 each by Does not apply route 2 (two) times daily.   docusate sodium 100 MG capsule Commonly known as:  COLACE Take 1 capsule (100 mg total) by mouth 2 (two) times daily.   gabapentin 300 MG capsule Commonly known as:  NEURONTIN Take 1 capsule (300 mg total) by mouth at bedtime.   glucose blood test strip Use as instructed two times daily   HYDROcodone-acetaminophen 5-325 MG tablet Commonly known as:  NORCO/VICODIN Take 1-2 tablets by mouth every 6 (six) hours as needed for moderate pain or severe pain.   liraglutide 18 MG/3ML Sopn Commonly known as:  VICTOZA Inject 0.2 mLs (1.2 mg total) into the skin daily.   losartan-hydrochlorothiazide 50-12.5 MG tablet Commonly known as:  HYZAAR TAKE 1 TABLET BY MOUTH EVERY DAY   metFORMIN 500 MG tablet Commonly known as:  GLUCOPHAGE TAKE 2 TABLETS BY MOUTH 2 TIMES DAILY WITH A MEAL. What changed:  See the new instructions.   ondansetron 4 MG tablet Commonly known as:  ZOFRAN Take 1 tablet (4 mg total) by mouth every 8 (eight) hours as needed for nausea or vomiting.   oxybutynin 5 MG tablet Commonly known as:  DITROPAN Take 1 tablet (5 mg total) by mouth every 8 (eight) hours as needed for bladder spasms.   Pen Needles 32G X 4 MM Misc 1 each by Does not apply route daily.   rosuvastatin 20 MG tablet Commonly known as:  CRESTOR TAKE 1 TABLET BY MOUTH EVERY DAY       Followup:  Follow-up Information    Ceasar Mons, MD Follow up on 10/26/2017.  Specialty:  Urology Why:  at 11:15 Contact information: [address]  [id number] [phone]

## 2017-12-02 ENCOUNTER — Other Ambulatory Visit: Payer: Self-pay | Admitting: Internal Medicine

## 2017-12-02 DIAGNOSIS — E782 Mixed hyperlipidemia: Principal | ICD-10-CM

## 2017-12-02 DIAGNOSIS — I10 Essential (primary) hypertension: Secondary | ICD-10-CM

## 2017-12-02 DIAGNOSIS — E1169 Type 2 diabetes mellitus with other specified complication: Secondary | ICD-10-CM

## 2017-12-25 ENCOUNTER — Encounter: Payer: Self-pay | Admitting: Internal Medicine

## 2017-12-25 ENCOUNTER — Other Ambulatory Visit: Payer: Self-pay

## 2017-12-25 ENCOUNTER — Ambulatory Visit (INDEPENDENT_AMBULATORY_CARE_PROVIDER_SITE_OTHER): Payer: BLUE CROSS/BLUE SHIELD | Admitting: Internal Medicine

## 2017-12-25 VITALS — BP 125/71 | HR 87 | Temp 98.4°F | Ht 66.0 in | Wt 228.4 lb

## 2017-12-25 DIAGNOSIS — N132 Hydronephrosis with renal and ureteral calculous obstruction: Secondary | ICD-10-CM

## 2017-12-25 DIAGNOSIS — E1169 Type 2 diabetes mellitus with other specified complication: Secondary | ICD-10-CM | POA: Diagnosis not present

## 2017-12-25 DIAGNOSIS — I10 Essential (primary) hypertension: Secondary | ICD-10-CM

## 2017-12-25 DIAGNOSIS — Z720 Tobacco use: Secondary | ICD-10-CM

## 2017-12-25 DIAGNOSIS — K5900 Constipation, unspecified: Secondary | ICD-10-CM | POA: Diagnosis not present

## 2017-12-25 DIAGNOSIS — E785 Hyperlipidemia, unspecified: Secondary | ICD-10-CM

## 2017-12-25 DIAGNOSIS — Z87448 Personal history of other diseases of urinary system: Secondary | ICD-10-CM

## 2017-12-25 DIAGNOSIS — Z7984 Long term (current) use of oral hypoglycemic drugs: Secondary | ICD-10-CM

## 2017-12-25 DIAGNOSIS — N131 Hydronephrosis with ureteral stricture, not elsewhere classified: Secondary | ICD-10-CM

## 2017-12-25 DIAGNOSIS — Z960 Presence of urogenital implants: Secondary | ICD-10-CM

## 2017-12-25 DIAGNOSIS — E1140 Type 2 diabetes mellitus with diabetic neuropathy, unspecified: Secondary | ICD-10-CM | POA: Diagnosis not present

## 2017-12-25 DIAGNOSIS — Z79899 Other long term (current) drug therapy: Secondary | ICD-10-CM

## 2017-12-25 DIAGNOSIS — Z6836 Body mass index (BMI) 36.0-36.9, adult: Secondary | ICD-10-CM

## 2017-12-25 DIAGNOSIS — E782 Mixed hyperlipidemia: Secondary | ICD-10-CM

## 2017-12-25 DIAGNOSIS — E1142 Type 2 diabetes mellitus with diabetic polyneuropathy: Secondary | ICD-10-CM

## 2017-12-25 DIAGNOSIS — F17210 Nicotine dependence, cigarettes, uncomplicated: Secondary | ICD-10-CM

## 2017-12-25 LAB — GLUCOSE, CAPILLARY: Glucose-Capillary: 128 mg/dL — ABNORMAL HIGH (ref 70–99)

## 2017-12-25 LAB — POCT GLYCOSYLATED HEMOGLOBIN (HGB A1C): Hemoglobin A1C: 7 % — AB (ref 4.0–5.6)

## 2017-12-25 MED ORDER — GABAPENTIN 300 MG PO CAPS: 300.0000 mg | ORAL_CAPSULE | Freq: Every day | ORAL | 3 refills | Status: AC

## 2017-12-25 MED ORDER — LOSARTAN POTASSIUM-HCTZ 50-12.5 MG PO TABS: 1.0000 | ORAL_TABLET | Freq: Every day | ORAL | 0 refills | Status: AC

## 2017-12-25 MED ORDER — METFORMIN HCL 1000 MG PO TABS: 1000.0000 mg | ORAL_TABLET | Freq: Two times a day (BID) | ORAL | 1 refills | Status: DC

## 2017-12-25 MED ORDER — LIRAGLUTIDE 18 MG/3ML ~~LOC~~ SOPN: 1.2000 mg | PEN_INJECTOR | Freq: Every day | SUBCUTANEOUS | 1 refills | Status: AC

## 2017-12-25 NOTE — Progress Notes (Signed)
   Subjective:    Patient ID: Traci Butler, female    DOB: 02/27/1975, 42 y.o.   MRN: [id number]  HPI  I have seen and examined this patient.  Patient feels well today.  She is here for routine follow-up of her diabetes and hypertension.  She states that she is compliant with all her medications.  She does complain of intermittent constipation but feels well otherwise.  Review of Systems  Constitutional: Negative.   HENT: Negative.   Cardiovascular: Negative.   Gastrointestinal: Positive for constipation.  Genitourinary:       She does complain of intermittent pain in her right flank after her right ureteral surgery  Musculoskeletal: Negative.   Skin: Negative.   Neurological: Negative.   Psychiatric/Behavioral: Negative.        Objective:   Physical Exam  Constitutional: She is oriented to person, place, and time. She appears well-developed and well-nourished.  HENT:  Head: Normocephalic and atraumatic.  Mouth/Throat: No oropharyngeal exudate.  Neck: Neck supple.  Cardiovascular: Normal rate, regular rhythm and normal heart sounds.  Pulmonary/Chest: Effort normal and breath sounds normal. She has no wheezes. She has no rales.  Abdominal: Soft. Bowel sounds are normal. She exhibits no distension. There is no tenderness.  Musculoskeletal: Normal range of motion.  Lymphadenopathy:    She has no cervical adenopathy.  Neurological: She is alert and oriented to person, place, and time.  Psychiatric: She has a normal mood and affect. Her behavior is normal.          Assessment & Plan:  Please see problem based charting for assessment and plan:

## 2017-12-25 NOTE — Assessment & Plan Note (Signed)
-  Patient continues to smoke half a pack a day -I explained to the patient the importance of quitting smoking -Patient states that she will try and cut back and will discuss this in detail on her follow-up visit

## 2017-12-25 NOTE — Patient Instructions (Signed)
-  It was a pleasure seeing you today -Your blood pressure is doing great.  Keep up the good work -Your A1c is 7 today.  Continue with her current medications for now -We will do a foot exam today -Please call me if you have any questions

## 2017-12-25 NOTE — Assessment & Plan Note (Signed)
-  This problem is chronic and stable -Patient has been watching her diet and exercising and trying to lose weight -She is down approximately 30 pounds over the last year -Her weight is stable from her last visit in September -Explained to patient the importance of continuing to try to lose weight and congratulated her on her overall weight loss

## 2017-12-25 NOTE — Assessment & Plan Note (Signed)
-  This problem is chronic and stable -We will continue with Crestor 20 mg daily -We will recheck her lipid panel on her follow-up visit

## 2017-12-25 NOTE — Assessment & Plan Note (Signed)
BP Readings from Last 3 Encounters:  12/25/17 125/71  10/19/17 102/80  10/12/17 (!) 137/94    Lab Results  Component Value Date   NA 133 (L) 10/18/2017   K 3.8 10/18/2017   CREATININE 1.00 10/18/2017    Assessment: Blood pressure control:  Well-controlled Progress toward BP goal:   At goal Comments: Patient is compliant with losartan/HCTZ 50/12.5 mg daily  Plan: Medications:  continue current medications Educational resources provided:   Self management tools provided:   Other plans:

## 2017-12-25 NOTE — Assessment & Plan Note (Signed)
-  This problem is chronic and stable -Patient states that she does intermittently get worsening tingling and numbness at night especially in her hands -we will continue with gabapentin 300 mg at night -I told the patient that she could take an extra pill at night if her symptoms are worse -No further work-up at this time

## 2017-12-25 NOTE — Assessment & Plan Note (Signed)
-  Patient is now status post right ureteral implant -She does complain of intermittent right-sided flank pain but states that she spoke with her urologist about this and this is to be expected after the surgery and should gradually resolve -Patient will continue to follow with urology as an outpatient -No further work-up at this time

## 2017-12-25 NOTE — Assessment & Plan Note (Signed)
Lab Results  Component Value Date   HGBA1C 7.0 (A) 12/25/2017   HGBA1C 6.9 (A) 10/02/2017   HGBA1C 7.3 06/05/2017     Assessment: Diabetes control:  Well-controlled Progress toward A1C goal:   At goal Comments: Patient is compliant with metformin 1000 mg twice daily as well as Victoza 1.2 mg daily  Plan: Medications:  continue current medications Home glucose monitoring: Frequency:   Timing:   Instruction/counseling given: discussed the need for weight loss Educational resources provided:   Self management tools provided:   Other plans: Patient encouraged to continue to try to lose weight.

## 2018-02-18 ENCOUNTER — Other Ambulatory Visit: Payer: Self-pay | Admitting: Internal Medicine

## 2018-02-18 DIAGNOSIS — E118 Type 2 diabetes mellitus with unspecified complications: Secondary | ICD-10-CM

## 2018-02-18 DIAGNOSIS — E1169 Type 2 diabetes mellitus with other specified complication: Secondary | ICD-10-CM

## 2018-02-19 MED ORDER — METFORMIN HCL 1000 MG PO TABS: 1000.0000 mg | ORAL_TABLET | Freq: Two times a day (BID) | ORAL | 1 refills | Status: AC

## 2018-04-15 ENCOUNTER — Telehealth: Payer: Self-pay | Admitting: Dietician

## 2018-04-15 NOTE — Telephone Encounter (Signed)
Called to follow up on patient's diabetes self care. She reports that she has a new doctor in [facility] and is no longer a patient in our practice. Will notify our front office of her change in Primary care offices.

## 2018-04-27 ENCOUNTER — Other Ambulatory Visit: Payer: Self-pay | Admitting: Internal Medicine

## 2018-04-27 DIAGNOSIS — E782 Mixed hyperlipidemia: Principal | ICD-10-CM

## 2018-04-27 DIAGNOSIS — E1169 Type 2 diabetes mellitus with other specified complication: Secondary | ICD-10-CM

## 2018-06-27 ENCOUNTER — Other Ambulatory Visit: Payer: Self-pay | Admitting: Internal Medicine

## 2018-06-27 DIAGNOSIS — I10 Essential (primary) hypertension: Secondary | ICD-10-CM

## 2019-02-20 ENCOUNTER — Other Ambulatory Visit: Payer: Self-pay | Admitting: Internal Medicine

## 2019-02-20 DIAGNOSIS — E1169 Type 2 diabetes mellitus with other specified complication: Secondary | ICD-10-CM

## 2019-07-01 IMAGING — CR DG ABDOMEN 1V
2 series · 2 of 2 positions shown · non-contrast
Comparison: None.

CLINICAL DATA: Postoperative abdominal pain. One week postop from
right salpingo oophorectomy.

EXAM:
ABDOMEN - 1 VIEW

[abdomen kub (1 of 2)]
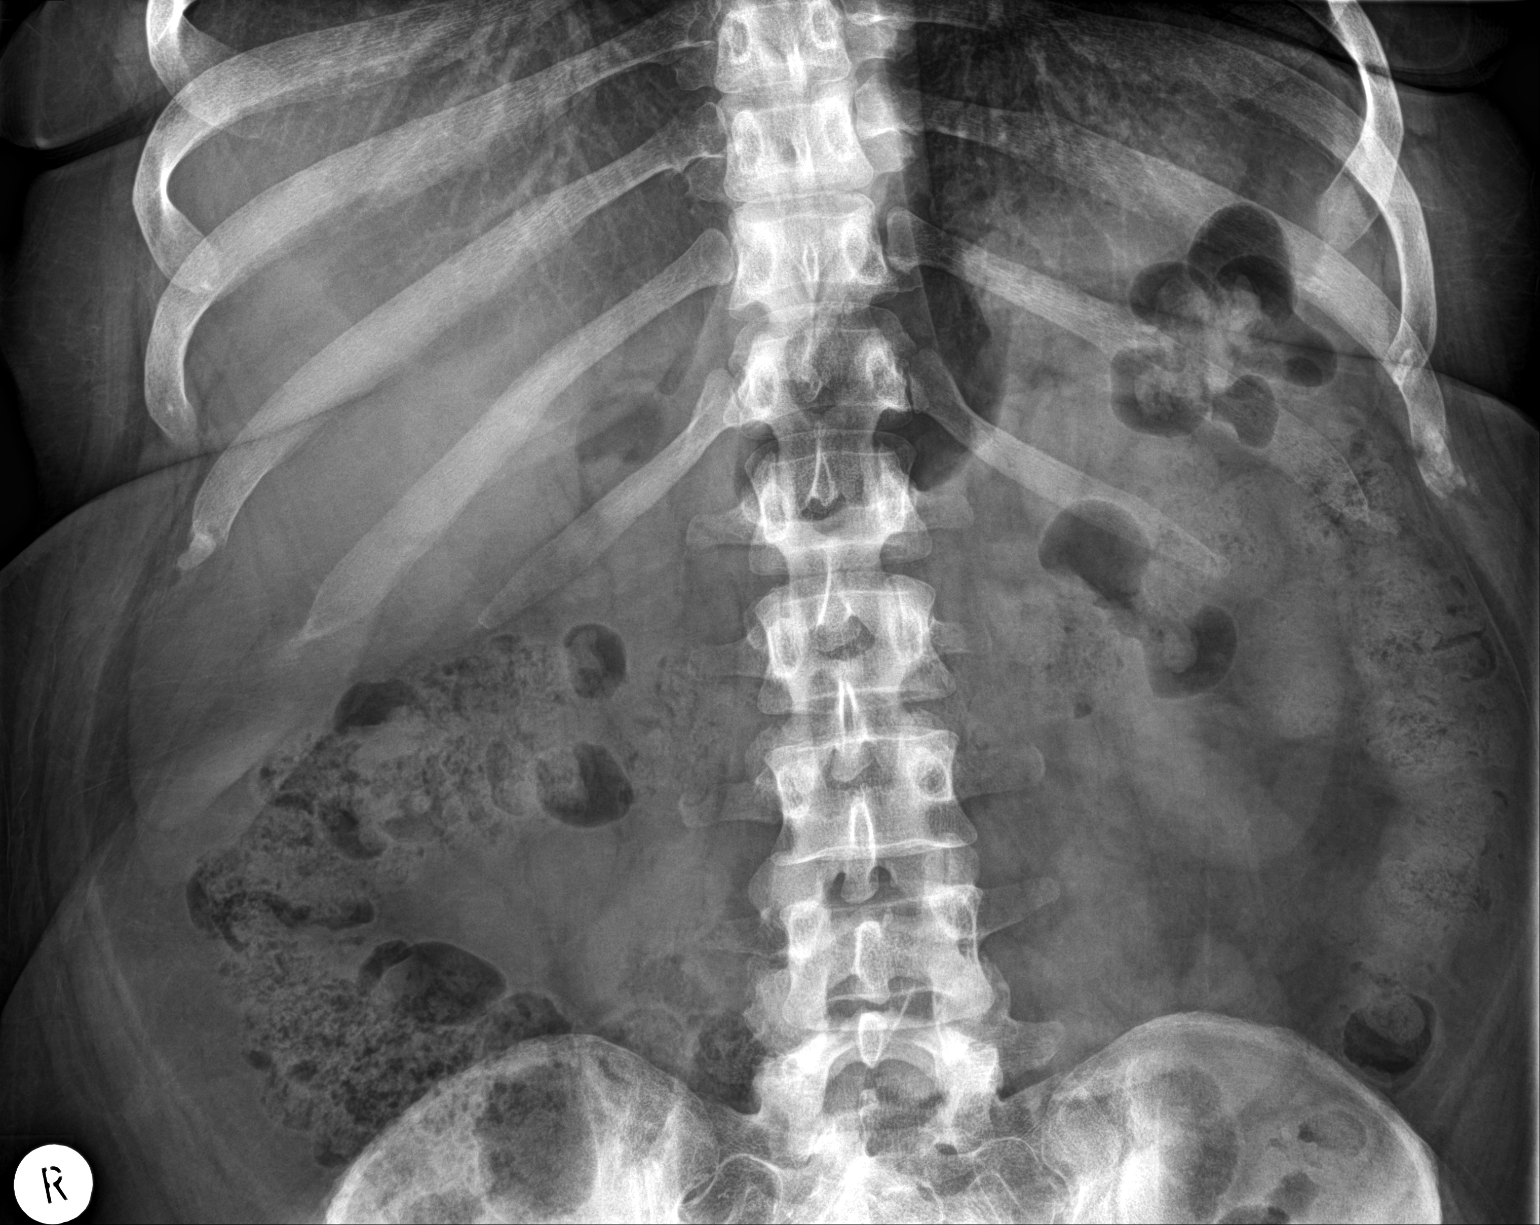

[abdomen kub (2 of 2)]
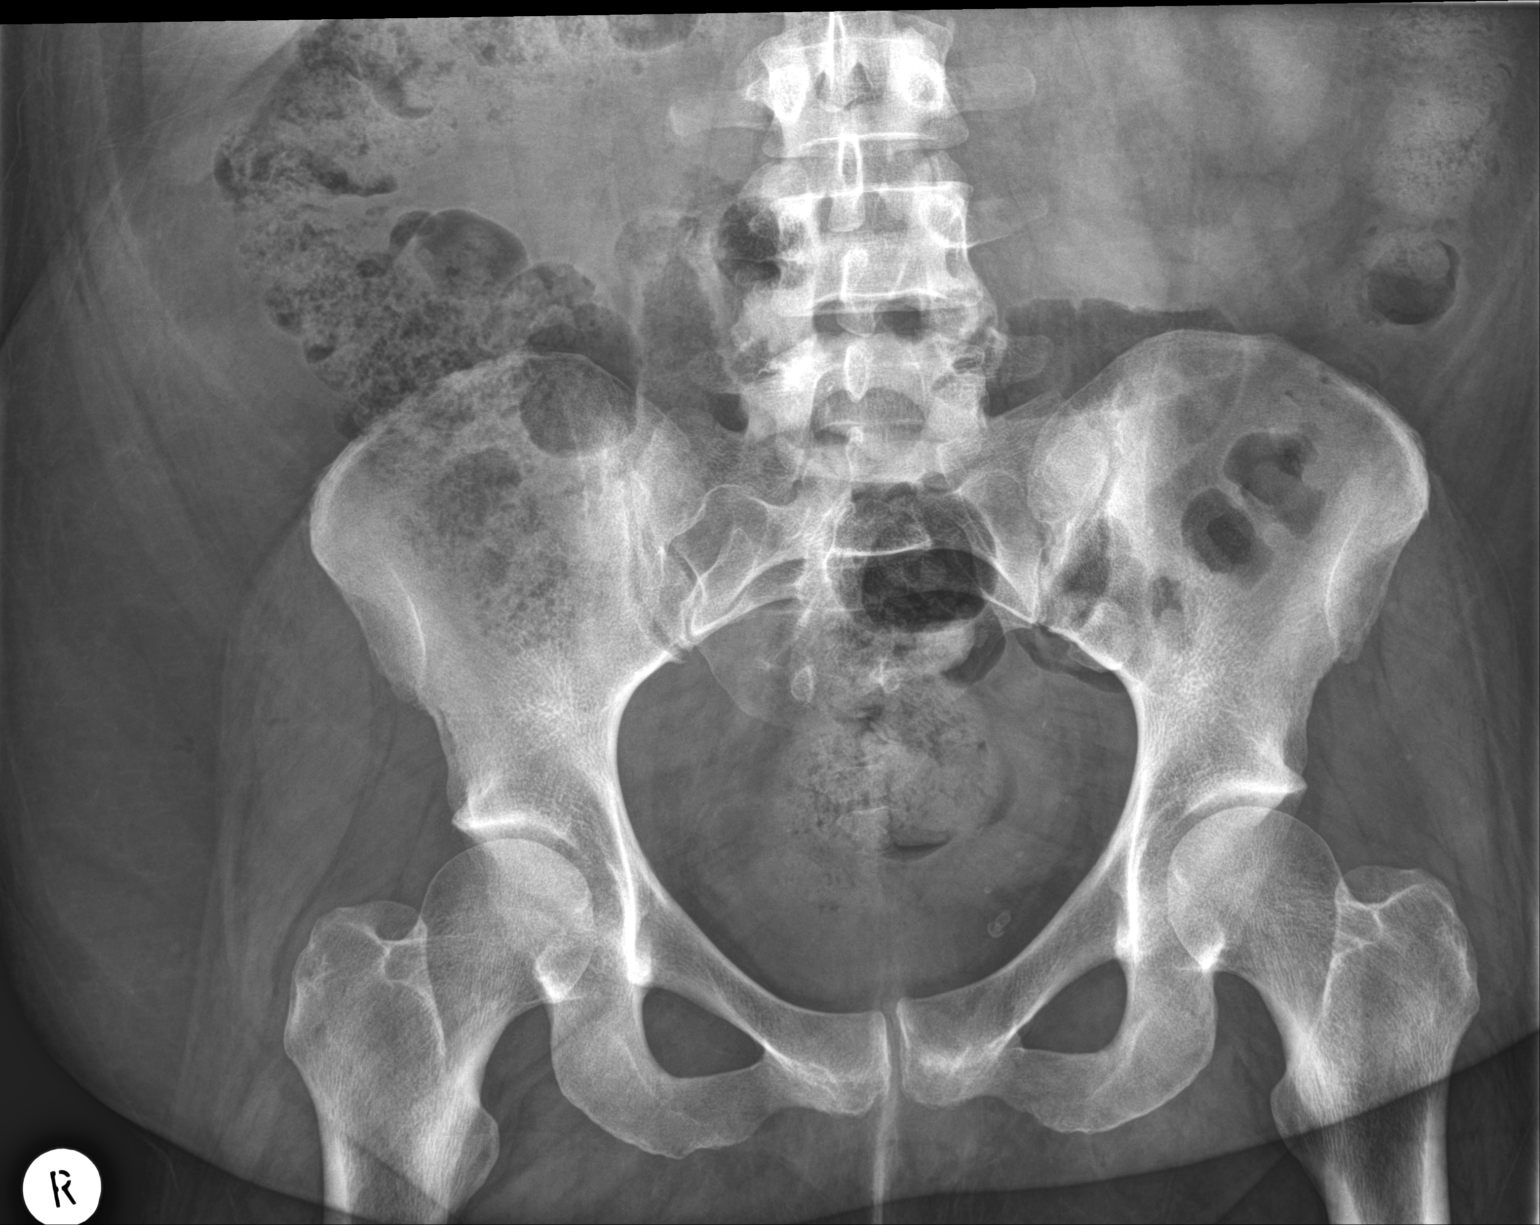

[2 of 2 positions shown; findings below may reference images not displayed]

FINDINGS: No evidence of dilated bowel loops. Moderate amount of stool is seen
throughout the colon. No radiopaque calculi identified.
IMPRESSION: Unremarkable bowel gas pattern.  No acute findings.

## 2019-08-07 ENCOUNTER — Other Ambulatory Visit: Payer: Self-pay | Admitting: Internal Medicine

## 2019-08-07 DIAGNOSIS — E1169 Type 2 diabetes mellitus with other specified complication: Secondary | ICD-10-CM

## 2019-11-24 ENCOUNTER — Ambulatory Visit (HOSPITAL_COMMUNITY)
Admission: EM | Admit: 2019-11-24 | Discharge: 2019-11-24 | Disposition: A | Discharge status: Home / Self Care | Payer: PRIVATE HEALTH INSURANCE | Attending: Urgent Care | Admitting: Urgent Care

## 2019-11-24 ENCOUNTER — Emergency Department (HOSPITAL_COMMUNITY)
Admission: EM | Admit: 2019-11-24 | Discharge: 2019-11-24 | Disposition: A | Discharge status: Home / Self Care | Payer: PRIVATE HEALTH INSURANCE | Attending: Emergency Medicine | Admitting: Emergency Medicine

## 2019-11-24 ENCOUNTER — Emergency Department (HOSPITAL_COMMUNITY): Payer: PRIVATE HEALTH INSURANCE

## 2019-11-24 ENCOUNTER — Encounter (HOSPITAL_COMMUNITY): Payer: Self-pay

## 2019-11-24 ENCOUNTER — Other Ambulatory Visit: Payer: Self-pay

## 2019-11-24 DIAGNOSIS — R112 Nausea with vomiting, unspecified: Secondary | ICD-10-CM | POA: Diagnosis not present

## 2019-11-24 DIAGNOSIS — E1140 Type 2 diabetes mellitus with diabetic neuropathy, unspecified: Secondary | ICD-10-CM | POA: Diagnosis not present

## 2019-11-24 DIAGNOSIS — R1013 Epigastric pain: Secondary | ICD-10-CM

## 2019-11-24 DIAGNOSIS — F17200 Nicotine dependence, unspecified, uncomplicated: Secondary | ICD-10-CM | POA: Diagnosis not present

## 2019-11-24 DIAGNOSIS — R1011 Right upper quadrant pain: Secondary | ICD-10-CM | POA: Diagnosis not present

## 2019-11-24 DIAGNOSIS — Z7984 Long term (current) use of oral hypoglycemic drugs: Secondary | ICD-10-CM | POA: Diagnosis not present

## 2019-11-24 DIAGNOSIS — Z794 Long term (current) use of insulin: Secondary | ICD-10-CM | POA: Insufficient documentation

## 2019-11-24 DIAGNOSIS — I10 Essential (primary) hypertension: Secondary | ICD-10-CM | POA: Insufficient documentation

## 2019-11-24 DIAGNOSIS — R1010 Upper abdominal pain, unspecified: Secondary | ICD-10-CM

## 2019-11-24 LAB — URINALYSIS, ROUTINE W REFLEX MICROSCOPIC
Bilirubin Urine: NEGATIVE
Glucose, UA: NEGATIVE mg/dL
Hgb urine dipstick: NEGATIVE
Ketones, ur: NEGATIVE mg/dL
Leukocytes,Ua: NEGATIVE
Nitrite: POSITIVE — AB
Protein, ur: NEGATIVE mg/dL
Specific Gravity, Urine: 1.014 (ref 1.005–1.030)
pH: 6 (ref 5.0–8.0)

## 2019-11-24 LAB — POCT URINALYSIS DIPSTICK, ED / UC
Bilirubin Urine: NEGATIVE
Glucose, UA: NEGATIVE mg/dL
Hgb urine dipstick: NEGATIVE
Ketones, ur: NEGATIVE mg/dL
Leukocytes,Ua: NEGATIVE
Nitrite: NEGATIVE
Protein, ur: NEGATIVE mg/dL
Specific Gravity, Urine: 1.025 (ref 1.005–1.030)
Urobilinogen, UA: 0.2 mg/dL (ref 0.0–1.0)
pH: 7 (ref 5.0–8.0)

## 2019-11-24 LAB — CBG MONITORING, ED
Glucose-Capillary: 107 mg/dL — ABNORMAL HIGH (ref 70–99)
Glucose-Capillary: 94 mg/dL (ref 70–99)

## 2019-11-24 LAB — CBC
HCT: 33.8 % — ABNORMAL LOW (ref 36.0–46.0)
Hemoglobin: 10.6 g/dL — ABNORMAL LOW (ref 12.0–15.0)
MCH: 25.4 pg — ABNORMAL LOW (ref 26.0–34.0)
MCHC: 31.4 g/dL (ref 30.0–36.0)
MCV: 81.1 fL (ref 80.0–100.0)
Platelets: 375 10*3/uL (ref 150–400)
RBC: 4.17 MIL/uL (ref 3.87–5.11)
RDW: 16.4 % — ABNORMAL HIGH (ref 11.5–15.5)
WBC: 10.5 10*3/uL (ref 4.0–10.5)
nRBC: 0 % (ref 0.0–0.2)

## 2019-11-24 LAB — COMPREHENSIVE METABOLIC PANEL
ALT: 16 U/L (ref 0–44)
AST: 22 U/L (ref 15–41)
Albumin: 3.6 g/dL (ref 3.5–5.0)
Alkaline Phosphatase: 48 U/L (ref 38–126)
Anion gap: 8 (ref 5–15)
BUN: 15 mg/dL (ref 6–20)
CO2: 22 mmol/L (ref 22–32)
Calcium: 8.7 mg/dL — ABNORMAL LOW (ref 8.9–10.3)
Chloride: 107 mmol/L (ref 98–111)
Creatinine, Ser: 0.96 mg/dL (ref 0.44–1.00)
GFR, Estimated: >60 mL/min (ref >60)
Glucose, Bld: 119 mg/dL — ABNORMAL HIGH (ref 70–99)
Potassium: 3.9 mmol/L (ref 3.5–5.1)
Sodium: 137 mmol/L (ref 135–145)
Total Bilirubin: 0.7 mg/dL (ref 0.3–1.2)
Total Protein: 7.1 g/dL (ref 6.5–8.1)

## 2019-11-24 LAB — I-STAT BETA HCG BLOOD, ED (MC, WL, AP ONLY): I-stat hCG, quantitative: <5 m[IU]/mL (ref <5)

## 2019-11-24 LAB — LIPASE, BLOOD: Lipase: 30 U/L (ref 11–51)

## 2019-11-24 MED ORDER — FENTANYL CITRATE (PF) 100 MCG/2ML IJ SOLN
100.0000 ug | Freq: Once | INTRAMUSCULAR | Status: AC
  Administered 2019-11-24: 100 ug via INTRAVENOUS
  Filled 2019-11-24: qty 2

## 2019-11-24 MED ORDER — ALUM & MAG HYDROXIDE-SIMETH 200-200-20 MG/5ML PO SUSP
ORAL | Status: AC
  Filled 2019-11-24: qty 30

## 2019-11-24 MED ORDER — FAMOTIDINE IN NACL 20-0.9 MG/50ML-% IV SOLN
20.0000 mg | Freq: Once | INTRAVENOUS | Status: AC
  Administered 2019-11-24: 20 mg via INTRAVENOUS
  Filled 2019-11-24: qty 50

## 2019-11-24 MED ORDER — ONDANSETRON HCL 4 MG/2ML IJ SOLN
4.0000 mg | Freq: Once | INTRAMUSCULAR | Status: AC
  Administered 2019-11-24: 4 mg via INTRAMUSCULAR

## 2019-11-24 MED ORDER — LIDOCAINE VISCOUS HCL 2 % MT SOLN
OROMUCOSAL | Status: AC
  Filled 2019-11-24: qty 15

## 2019-11-24 MED ORDER — ONDANSETRON HCL 4 MG/2ML IJ SOLN
4.0000 mg | Freq: Once | INTRAMUSCULAR | Status: AC
  Administered 2019-11-24: 4 mg via INTRAVENOUS
  Filled 2019-11-24: qty 2

## 2019-11-24 MED ORDER — IOHEXOL 300 MG/ML  SOLN
100.0000 mL | Freq: Once | INTRAMUSCULAR | Status: AC | PRN
  Administered 2019-11-24: 100 mL via INTRAVENOUS

## 2019-11-24 MED ORDER — ONDANSETRON HCL 4 MG/2ML IJ SOLN
INTRAMUSCULAR | Status: AC
  Filled 2019-11-24: qty 2

## 2019-11-24 MED ORDER — FENTANYL CITRATE (PF) 100 MCG/2ML IJ SOLN
50.0000 ug | Freq: Once | INTRAMUSCULAR | Status: AC
  Administered 2019-11-24: 50 ug via INTRAVENOUS
  Filled 2019-11-24: qty 2

## 2019-11-24 MED ORDER — LIDOCAINE VISCOUS HCL 2 % MT SOLN
15.0000 mL | Freq: Once | OROMUCOSAL | Status: AC
  Administered 2019-11-24: 15 mL via ORAL

## 2019-11-24 MED ORDER — ONDANSETRON 4 MG PO TBDP: 4.0000 mg | ORAL_TABLET | Freq: Three times a day (TID) | ORAL | 0 refills | Status: DC | PRN

## 2019-11-24 MED ORDER — ALUM & MAG HYDROXIDE-SIMETH 200-200-20 MG/5ML PO SUSP
30.0000 mL | Freq: Once | ORAL | Status: AC
  Administered 2019-11-24: 30 mL via ORAL

## 2019-11-24 MED ORDER — OXYCODONE-ACETAMINOPHEN 5-325 MG PO TABS
1.0000 | ORAL_TABLET | ORAL | Status: DC | PRN
  Administered 2019-11-24: 1 via ORAL
  Filled 2019-11-24: qty 1

## 2019-11-24 MED ORDER — LIDOCAINE VISCOUS HCL 2 % MT SOLN
15.0000 mL | Freq: Once | OROMUCOSAL | Status: AC
  Administered 2019-11-24: 15 mL via ORAL
  Filled 2019-11-24: qty 15

## 2019-11-24 MED ORDER — ALUM & MAG HYDROXIDE-SIMETH 200-200-20 MG/5ML PO SUSP
30.0000 mL | Freq: Once | ORAL | Status: AC
  Administered 2019-11-24: 30 mL via ORAL
  Filled 2019-11-24: qty 30

## 2019-11-24 NOTE — Discharge Instructions (Signed)
Please report to the emergency room as you need a higher level of evaluation and care than we can provide in the urgent care setting. My hope is that you will get a CT scan of your abdomen and labs given your severe abdominal pain especially in light of your significant surgical history. Please have your husband drive you there now.

## 2019-11-24 NOTE — Discharge Instructions (Addendum)
You were seen today for abdominal pain, as we spoke about you most likely need an endoscopy to further evaluate your symptoms.  Please follow-up with a GI doctor, please call Dr. Michail Sermon, his information as above.  You can take the Zofran as needed for nausea.  You can also take over-the-counter Pepcid as directed on bottle.  You can also use over-the-counter Tylenol as directed on the bottle for pain.  If you have any new or worsening concerning symptoms, cannot keep anything down, have severe pain please come back to the emergency department.  Please use the attached instructions.  Make sure to stay hydrated and get plenty of rest.

## 2019-11-24 NOTE — ED Triage Notes (Addendum)
Pt presents from UC with generalized abd pain x2 days. Pt woke this am to go to work with "intense hunger pain" pt received a GI cocktail and 4 mg Zofran IM at UC. They sent her here for further evaluation. Pt HTN is triage. States she has not taken any of her meds since yesterday am, did not take pm or this am doses.  EKG completed at [facility]

## 2019-11-24 NOTE — ED Provider Notes (Addendum)
[facility] EMERGENCY DEPARTMENT Provider Note   CSN: [id number] Arrival date & time: 11/24/19  1015     History Chief Complaint  Patient presents with  . Abdominal Pain    Traci Butler is a 44 y.o. female with past medical history of hypertension, diabetes, high cholesterol that presents emergency department today for abdominal pain that started yesterday.  Patient states that she has a gnawing, intense hunger pain in her epigastrium with associated right upper quadrant pain.  States that the pain was severe that she could not sleep last night.  Also states that she vomited twice last night and twice this morning, no hematemesis.  Denies any diarrhea.  States that she feels nauseous currently.  Urgent care sent here here today for further evaluation.  Denies any fevers, chills.  States that she did drink some alcohol on Saturday, states that she is not a chronic drinker.  Denies any dysuria or hematuria.  Never had an endoscopy.  No vaginal pain or complaints.  States that she was in normal health before this.  States that she has had abdominal surgeries including appendectomy and surgery to remove her ovarian cyst.  Denies any gallbladder surgery.  Does state that she is able to pass gas, last bowel movement this morning.  Denies any chest pain or shortness of breath.  HPI     Past Medical History:  Diagnosis Date  . Diabetes mellitus without complication ([place])   . High cholesterol   . Hypertension     Patient Active Problem List   Diagnosis Date Noted  . Hydronephrosis due to obstruction of ureter 09/03/2017  . Ovarian cyst 06/05/2017  . Diabetic neuropathy ([place]) 03/06/2017  . Severe obesity (BMI >= 40) ([place]) 07/10/2015  . HLD (hyperlipidemia) 07/10/2015  . Essential hypertension 07/09/2015  . Healthcare maintenance 07/09/2015  . Tobacco abuse 09/21/2010  . Type 2 diabetes mellitus with other specified complication ([place]) [id number]    Past Surgical  History:  Procedure Laterality Date  . APPENDECTOMY  2008  . CESAREAN SECTION  2005   live birth  . CESAREAN SECTION  2011   live birth  . CYSTOSCOPY N/A 08/21/2017   Procedure: CYSTOSCOPY;  Surgeon: Mora Bellman, MD;  Location: [facility];  Service: Gynecology;  Laterality: N/A;  . CYSTOSCOPY W/ URETERAL STENT PLACEMENT Right 09/01/2017   Procedure: CYSTOSCOPY WITH RETROGRADE PYELOGRAM/URETEROSCOPY;  Surgeon: Irine Seal, MD;  Location: [facility];  Service: Urology;  Laterality: Right;  . IR NEPHROSTOMY EXCHANGE RIGHT  10/12/2017  . IR NEPHROSTOMY PLACEMENT RIGHT  09/01/2017  . LAPAROSCOPIC SALPINGO OOPHERECTOMY Right 08/21/2017   Procedure: LAPAROSCOPIC RIGHT SALPINGO OOPHORECTOMY;  Surgeon: Mora Bellman, MD;  Location: [facility];  Service: Gynecology;  Laterality: Right;  . TONSILLECTOMY    . WISDOM TOOTH EXTRACTION  2015     OB History    Gravida  4   Para      Term      Preterm      AB      Living  3     SAB      TAB      Ectopic      Multiple      Live Births              Family History  Problem Relation Age of Onset  . Diabetes Mother   . Hypertension Father     Social History   Tobacco Use  . Smoking status: Current Some Day Smoker  Packs/day: 0.50    Years: 25.00    Pack years: 12.50  . Smokeless tobacco: Never Used  . Tobacco comment: 1 pack last 2.5 days   Vaping Use  . Vaping Use: Never used  Substance Use Topics  . Alcohol use: Yes    Alcohol/week: 2.0 standard drinks    Types: 2 Shots of liquor per week    Comment: WEEKENDS ONLY  . Drug use: No    Home Medications Prior to Admission medications   Medication Sig Start Date End Date Taking? Authorizing Provider  BAYER MICROLET LANCETS lancets Use as instructed two times daily 08/21/16   Axel Filler, MD  Blood Glucose Monitoring Suppl (CONTOUR NEXT ONE) KIT 1 each by Does not apply route 2 (two) times daily. 08/21/16   Axel Filler, MD  gabapentin (NEURONTIN) 300 MG  capsule Take 1 capsule (300 mg total) by mouth at bedtime. 12/25/17   Aldine Contes, MD  glucose blood (CONTOUR NEXT TEST) test strip Use as instructed two times daily 08/21/16   Axel Filler, MD  Insulin Pen Needle (PEN NEEDLES) 32G X 4 MM MISC 1 each by Does not apply route daily. 10/19/16   Aldine Contes, MD  liraglutide (VICTOZA) 18 MG/3ML SOPN Inject 0.2 mLs (1.2 mg total) into the skin daily. 12/25/17   Aldine Contes, MD  losartan-hydrochlorothiazide (HYZAAR) 50-12.5 MG tablet Take 1 tablet by mouth daily. 12/25/17   Aldine Contes, MD  metFORMIN (GLUCOPHAGE) 1000 MG tablet Take 1 tablet (1,000 mg total) by mouth 2 (two) times daily with a meal. 02/19/18   Aldine Contes, MD  ondansetron (ZOFRAN ODT) 4 MG disintegrating tablet Take 1 tablet (4 mg total) by mouth every 8 (eight) hours as needed for nausea or vomiting. 11/24/19   Alfredia Client, PA-C  rosuvastatin (CRESTOR) 20 MG tablet TAKE 1 TABLET BY MOUTH EVERY DAY 04/29/18   Aldine Contes, MD    Allergies    Patient has no known allergies.  Review of Systems   Review of Systems  Constitutional: Negative for chills, diaphoresis, fatigue and fever.  HENT: Negative for congestion, sore throat and trouble swallowing.   Eyes: Negative for pain and visual disturbance.  Respiratory: Negative for cough, shortness of breath and wheezing.   Cardiovascular: Negative for chest pain, palpitations and leg swelling.  Gastrointestinal: Positive for abdominal pain, nausea and vomiting. Negative for abdominal distention and diarrhea.  Genitourinary: Negative for difficulty urinating, dysuria, enuresis, flank pain, frequency, hematuria, vaginal discharge and vaginal pain.  Musculoskeletal: Negative for back pain, neck pain and neck stiffness.  Skin: Negative for pallor.  Neurological: Negative for dizziness, speech difficulty, weakness and headaches.  Psychiatric/Behavioral: Negative for confusion.    Physical Exam Updated  Vital Signs BP (!) 144/96   Pulse (!) 156   Temp 97.6 F (36.4 C) (Oral)   Resp 16   Ht 5' 6"  (1.676 m)   Wt 108.9 kg   SpO2 94%   BMI 38.74 kg/m   Physical Exam Constitutional:      General: She is not in acute distress.    Appearance: Normal appearance. She is obese. She is not ill-appearing, toxic-appearing or diaphoretic.  HENT:     Mouth/Throat:     Mouth: Mucous membranes are moist.     Pharynx: Oropharynx is clear.  Eyes:     General: No scleral icterus.    Extraocular Movements: Extraocular movements intact.     Pupils: Pupils are equal, round, and reactive to light.  Cardiovascular:  Rate and Rhythm: Normal rate and regular rhythm.     Pulses: Normal pulses.     Heart sounds: Normal heart sounds.  Pulmonary:     Effort: Pulmonary effort is normal. No respiratory distress.     Breath sounds: Normal breath sounds. No stridor. No wheezing, rhonchi or rales.  Chest:     Chest wall: No tenderness.  Abdominal:     General: Abdomen is flat. There is no distension.     Palpations: Abdomen is soft.     Tenderness: There is abdominal tenderness in the right upper quadrant and epigastric area. There is no guarding or rebound. Negative signs include Murphy's sign, Rovsing's sign, McBurney's sign, psoas sign and obturator sign.  Musculoskeletal:        General: No swelling or tenderness. Normal range of motion.     Cervical back: Normal range of motion and neck supple. No rigidity.     Right lower leg: No edema.     Left lower leg: No edema.  Skin:    General: Skin is warm and dry.     Capillary Refill: Capillary refill takes less than 2 seconds.     Coloration: Skin is not pale.  Neurological:     General: No focal deficit present.     Mental Status: She is alert and oriented to person, place, and time.  Psychiatric:        Mood and Affect: Mood normal.        Behavior: Behavior normal.     ED Results / Procedures / Treatments   Labs (all labs ordered are  listed, but only abnormal results are displayed) Labs Reviewed  COMPREHENSIVE METABOLIC PANEL - Abnormal; Notable for the following components:      Result Value   Glucose, Bld 119 (*)    Calcium 8.7 (*)    All other components within normal limits  CBC - Abnormal; Notable for the following components:   Hemoglobin 10.6 (*)    HCT 33.8 (*)    MCH 25.4 (*)    RDW 16.4 (*)    All other components within normal limits  URINALYSIS, ROUTINE W REFLEX MICROSCOPIC - Abnormal; Notable for the following components:   APPearance HAZY (*)    Nitrite POSITIVE (*)    Bacteria, UA MANY (*)    All other components within normal limits  CBG MONITORING, ED - Abnormal; Notable for the following components:   Glucose-Capillary 107 (*)    All other components within normal limits  URINE CULTURE  LIPASE, BLOOD  I-STAT BETA HCG BLOOD, ED ([facility], [facility], [facility] ONLY)  CBG MONITORING, ED    EKG None  Radiology CT Abdomen Pelvis W Contrast  Result Date: 11/24/2019 CLINICAL DATA:  Severe upper abdominal pain and nausea for 1 day EXAM: CT ABDOMEN AND PELVIS WITH CONTRAST TECHNIQUE: Multidetector CT imaging of the abdomen and pelvis was performed using the standard protocol following bolus administration of intravenous contrast. CONTRAST:  159m OMNIPAQUE IOHEXOL 300 MG/ML  SOLN COMPARISON:  11/24/2019, 02/12/2019 FINDINGS: Lower chest: No acute pleural or parenchymal lung disease. Hepatobiliary: No focal liver abnormality is seen. No gallstones, gallbladder wall thickening, or biliary dilatation. Pancreas: Unremarkable. No pancreatic ductal dilatation or surrounding inflammatory changes. Spleen: Normal in size without focal abnormality. Adrenals/Urinary Tract: Postsurgical changes related to right ureteral reimplantation along the right superior margin of the bladder. Kidneys enhance normally and symmetrically. No urinary tract calculi or obstruction. The adrenals and bladder are unremarkable. Stomach/Bowel: The appendix  is  surgically absent. No bowel obstruction or ileus. No wall thickening or inflammatory change. Vascular/Lymphatic: Aortic atherosclerosis. No enlarged abdominal or pelvic lymph nodes. Reproductive: Uterus and bilateral adnexa are unremarkable. Other: No abdominal wall hernia or abnormality. No abdominopelvic ascites. Musculoskeletal: No acute or destructive bony lesions. Reconstructed images demonstrate no additional findings. IMPRESSION: 1. No acute intra-abdominal or intrapelvic process. 2. Postsurgical changes related to right ureteral reimplantation. 3. Aortic Atherosclerosis (ICD10-I70.0). Electronically Signed   By: Randa Ngo M.D.   On: 11/24/2019 21:31   US Abdomen Limited RUQ (LIVER/GB)  Result Date: 11/24/2019 CLINICAL DATA:  Right upper quadrant and epigastric pain. EXAM: ULTRASOUND ABDOMEN LIMITED RIGHT UPPER QUADRANT COMPARISON:  Most recent CT 02/12/2019 FINDINGS: Gallbladder: Physiologically distended. No gallstones or wall thickening visualized. No sonographic Murphy sign noted by sonographer. Common bile duct: Diameter: 4-5 mm, normal. Liver: No focal lesion identified. Within normal limits in parenchymal echogenicity. Portal vein is patent on color Doppler imaging with normal direction of blood flow towards the liver. Other: No right upper quadrant ascites. IMPRESSION: Unremarkable right upper quadrant ultrasound. Electronically Signed   By: Keith Rake M.D.   On: 11/24/2019 19:59    Procedures Procedures (including critical care time)  Medications Ordered in ED Medications  oxyCODONE-acetaminophen (PERCOCET/ROXICET) 5-325 MG per tablet 1 tablet (1 tablet Oral Given 11/24/19 1036)  fentaNYL (SUBLIMAZE) injection 100 mcg (100 mcg Intravenous Given 11/24/19 1919)  ondansetron (ZOFRAN) injection 4 mg (4 mg Intravenous Given 11/24/19 1919)  famotidine (PEPCID) IVPB 20 mg premix (0 mg Intravenous Stopped 11/24/19 1952)  fentaNYL (SUBLIMAZE) injection 50 mcg (50 mcg Intravenous  Given 11/24/19 2049)  iohexol (OMNIPAQUE) 300 MG/ML solution 100 mL (100 mLs Intravenous Contrast Given 11/24/19 2122)  alum & mag hydroxide-simeth (MAALOX/MYLANTA) 200-200-20 MG/5ML suspension 30 mL (30 mLs Oral Given 11/24/19 2214)    And  lidocaine (XYLOCAINE) 2 % viscous mouth solution 15 mL (15 mLs Oral Given 11/24/19 2214)    ED Course  I have reviewed the triage vital signs and the nursing notes.  Pertinent labs & imaging results that were available during my care of the patient were reviewed by me and considered in my medical decision making (see chart for details).    MDM Rules/Calculators/A&P                          Traci Butler is a 44 y.o. female with past medical history of hypertension, diabetes, high cholesterol that presents emergency department today for abdominal pain that started yesterday.  Patient with pain in epigastric area and right upper quadrant, no guarding, no true Murphy sign.  No chest pain, no concerns for atypical ACS, patient obtained EKG at urgent care which did not shows any signs of ischemia, normal QTC.  Differential diagnoses considered include peptic ulcer disease, gastritis, gastroparesis, cholecystitis, pancreatitis.  Low suspicion for bowel obstruction, patient is able to pass gas and description of pain is more suspicious for peptic ulcer.  Initial interventions Zofran, fentanyl and Pepcid.  Labs demonstrated unremarkable CBC, hemoglobin stable at 10.6.  CMP unremarkable, urinalysis shows positive nitrites, no leukocytes, patient states that she is not having any dysuria or hematuria, will obtain urine culture.  Did ask patient if she wants me start her on antibiotics since urine could appear to have UTI, patient states that she wants to wait for urine culture at this time since she is not having any symptoms of dysuria and hematuria, do not think that epigastric pain  is related to her possible UTI.  I am okay with waiting for urine culture, patient  had urinalysis done at urgent care today which PA-C said was completely normal.  Will start with right upper quadrant ultrasound since patient is having right upper quadrant tenderness progressed to CT if that is necessary.  RUQ ultrasound negative, CT also negative for acute intra-abdominal pathology.  Upon reassessment patient states that she feels much better with pain medications on board, patient passed p.o. challenge.  Discussed that patient most likely needs endoscopy, will refer to GI at this time.  Patient agreeable.  Patient to be discharged.  Symptomatic treatment discussed.  Strict return precautions given.  Suspicion for peptic ulcer disease or gastritis.  Patient ready to leave, she states that she feels so much better that she could" run out of here." Still denies antibiotics for questionable UTI, wants to wait for urine culture.   Doubt need for further emergent work up at this time. I explained the diagnosis and have given explicit precautions to return to the ER including for any other new or worsening symptoms. The patient understands and accepts the medical plan as it's been dictated and I have answered their questions. Discharge instructions concerning home care and prescriptions have been given. The patient is STABLE and is discharged to home in good condition.  Final Clinical Impression(s) / ED Diagnoses Final diagnoses:  Epigastric pain  RUQ pain    Rx / DC Orders ED Discharge Orders         Ordered    ondansetron (ZOFRAN ODT) 4 MG disintegrating tablet  Every 8 hours PRN        11/24/19 2148    Ambulatory referral to Gastroenterology        11/24/19 2221              Blanchie Dessert, MD 11/24/19 2237    Alfredia Client, PA-C 11/24/19 5974    Blanchie Dessert, MD 11/25/19 2142

## 2019-11-24 NOTE — ED Notes (Signed)
Patient transported to CT 

## 2019-11-24 NOTE — ED Notes (Addendum)
Patient is being discharged from the Urgent Care and sent to the Emergency Department via pov. Per Jaynee Eagles, PA, patient is in need of higher level of care due to severe abdominal pain, n/v, and abdominal CT. Patient is aware and verbalizes understanding of plan of care.  Vitals:   11/24/19 0854 11/24/19 0916  BP: (!) 155/84   Pulse: (!) 130 68  Resp: 20   SpO2: 99% 100%

## 2019-11-24 NOTE — ED Triage Notes (Addendum)
Pt I with c/o severe epigastric pain that started yesterday. States it feels like a hunger pain.  Not painful to palpation  Pt took pepto bismol and tylenol yesterday with no relief  Denies any n/v, sob,  Jaw/arm/back pain, or injury to area  EKG ordered and completed, shown to T. Rozanna Box, NP

## 2019-11-24 NOTE — ED Provider Notes (Signed)
[facility]   MRN: [id number] DOB: [birth year]  Subjective:   Traci Butler is a 44 y.o. female presenting for 1 day history of acute onset severe upper abdominal pain with nausea and a lot of gagging.  Pain is 10 out of 10.  Denies vomiting, states that she keeps feeling the need to throw up and gags but nothing comes up.  Reports that she eats "regular foods ".  Has had a significant surgical history including 2 C-sections, laparoscopic oophorectomy, appendectomy and major ureteral surgery.  Denies difficulty with her bowel movements, diarrhea, bloody stools, dysuria, painful urination, urinary frequency.  She does have a history of diabetes and is well controlled with A1c is less than 7% for the past 2 years.  No current facility-administered medications for this encounter.  Current Outpatient Medications:  .  gabapentin (NEURONTIN) 300 MG capsule, Take 1 capsule (300 mg total) by mouth at bedtime., Disp: 90 capsule, Rfl: 3 .  liraglutide (VICTOZA) 18 MG/3ML SOPN, Inject 0.2 mLs (1.2 mg total) into the skin daily., Disp: 9 pen, Rfl: 1 .  losartan-hydrochlorothiazide (HYZAAR) 50-12.5 MG tablet, Take 1 tablet by mouth daily., Disp: 90 tablet, Rfl: 0 .  metFORMIN (GLUCOPHAGE) 1000 MG tablet, Take 1 tablet (1,000 mg total) by mouth 2 (two) times daily with a meal., Disp: 180 tablet, Rfl: 1 .  rosuvastatin (CRESTOR) 20 MG tablet, TAKE 1 TABLET BY MOUTH EVERY DAY, Disp: 90 tablet, Rfl: 0 .  BAYER MICROLET LANCETS lancets, Use as instructed two times daily, Disp: 100 each, Rfl: 9 .  Blood Glucose Monitoring Suppl (CONTOUR NEXT ONE) KIT, 1 each by Does not apply route 2 (two) times daily., Disp: 1 kit, Rfl: 1 .  glucose blood (CONTOUR NEXT TEST) test strip, Use as instructed two times daily, Disp: 100 each, Rfl: 9 .  Insulin Pen Needle (PEN NEEDLES) 32G X 4 MM MISC, 1 each by Does not apply route daily., Disp: 100 each, Rfl: 4   No Known Allergies  Past Medical History:    Diagnosis Date  . Diabetes mellitus without complication ([place])   . High cholesterol   . Hypertension      Past Surgical History:  Procedure Laterality Date  . APPENDECTOMY  2008  . CESAREAN SECTION  2005   live birth  . CESAREAN SECTION  2011   live birth  . CYSTOSCOPY N/A 08/21/2017   Procedure: CYSTOSCOPY;  Surgeon: Mora Bellman, MD;  Location: [facility];  Service: Gynecology;  Laterality: N/A;  . CYSTOSCOPY W/ URETERAL STENT PLACEMENT Right 09/01/2017   Procedure: CYSTOSCOPY WITH RETROGRADE PYELOGRAM/URETEROSCOPY;  Surgeon: Irine Seal, MD;  Location: [facility];  Service: Urology;  Laterality: Right;  . IR NEPHROSTOMY EXCHANGE RIGHT  10/12/2017  . IR NEPHROSTOMY PLACEMENT RIGHT  09/01/2017  . LAPAROSCOPIC SALPINGO OOPHERECTOMY Right 08/21/2017   Procedure: LAPAROSCOPIC RIGHT SALPINGO OOPHORECTOMY;  Surgeon: Mora Bellman, MD;  Location: [facility];  Service: Gynecology;  Laterality: Right;  . TONSILLECTOMY    . WISDOM TOOTH EXTRACTION  2015    Family History  Problem Relation Age of Onset  . Diabetes Mother   . Hypertension Father     Social History   Tobacco Use  . Smoking status: Current Some Day Smoker    Packs/day: 0.50    Years: 25.00    Pack years: 12.50  . Smokeless tobacco: Never Used  . Tobacco comment: 1 pack last 2.5 days   Vaping Use  . Vaping Use: Never used  Substance Use Topics  . Alcohol use: Yes    Alcohol/week: 2.0 standard drinks    Types: 2 Shots of liquor per week    Comment: WEEKENDS ONLY  . Drug use: No    ROS   Objective:   Vitals: BP (!) 155/84 (BP Location: Right Arm)   Pulse 68   Resp 20   SpO2 100%   Temp 98.39F.   Physical Exam Constitutional:      General: She is not in acute distress.    Appearance: Normal appearance. She is well-developed. She is obese. She is not ill-appearing, toxic-appearing or diaphoretic.  HENT:     Head: Normocephalic and atraumatic.     Right Ear: External ear normal.     Left Ear: External ear  normal.     Nose: Nose normal.     Mouth/Throat:     Mouth: Mucous membranes are moist.     Pharynx: Oropharynx is clear.  Eyes:     General: No scleral icterus.    Extraocular Movements: Extraocular movements intact.     Pupils: Pupils are equal, round, and reactive to light.  Cardiovascular:     Rate and Rhythm: Normal rate and regular rhythm.     Heart sounds: Normal heart sounds. No murmur heard.  No friction rub. No gallop.   Pulmonary:     Effort: Pulmonary effort is normal. No respiratory distress.     Breath sounds: Normal breath sounds. No stridor. No wheezing, rhonchi or rales.  Abdominal:     General: Bowel sounds are normal. There is no distension.     Palpations: Abdomen is soft. There is no mass.     Tenderness: There is generalized abdominal tenderness and tenderness in the right upper quadrant, epigastric area and left upper quadrant. There is guarding. There is no right CVA tenderness, left CVA tenderness or rebound.  Skin:    General: Skin is warm and dry.     Coloration: Skin is not pale.     Findings: No rash.  Neurological:     General: No focal deficit present.     Mental Status: She is alert and oriented to person, place, and time.  Psychiatric:        Mood and Affect: Mood normal.        Behavior: Behavior normal.        Thought Content: Thought content normal.        Judgment: Judgment normal.     ED ECG REPORT   Date: 11/24/2019  Rate: 63bpm  Rhythm: normal sinus rhythm  QRS Axis: normal  Intervals: normal  ST/T Wave abnormalities: nonspecific T wave changes  Conduction Disutrbances:none  Narrative Interpretation: Nonspecific T wave inversion in lead III, otherwise sinus rhythm at 63 bpm.  Comparable to previous EKG from 2019.  Old EKG Reviewed: unchanged  I have personally reviewed the EKG tracing and agree with the computerized printout as noted.  Results for orders placed or performed during the hospital encounter of 11/24/19 (from the  past 24 hour(s))  POC Urinalysis dipstick     Status: None   Collection Time: 11/24/19  9:32 AM  Result Value Ref Range   Glucose, UA NEGATIVE NEGATIVE mg/dL   Bilirubin Urine NEGATIVE NEGATIVE   Ketones, ur NEGATIVE NEGATIVE mg/dL   Specific Gravity, Urine 1.025 1.005 - 1.030   Hgb urine dipstick NEGATIVE NEGATIVE   pH 7.0 5.0 - 8.0   Protein, ur NEGATIVE NEGATIVE mg/dL   Urobilinogen, UA 0.2 0.0 -  1.0 mg/dL   Nitrite NEGATIVE NEGATIVE   Leukocytes,Ua NEGATIVE NEGATIVE     Assessment and Plan :   PDMP not reviewed this encounter.  1. Pain of upper abdomen   2. Nausea and vomiting, intractability of vomiting not specified, unspecified vomiting type     Patient has severe upper abdominal pain, guarding on exam.  EKG and urinalysis completely normal.  Recommended patient report to the emergency room for CT scan abdomen, further laboratory evaluation.  She was given a GI cocktail, IM Zofran in clinic.  Patient contracts for safety, her husband will drive her to the ER now.   Jaynee Eagles, PA-C 11/24/19 1027

## 2019-11-26 LAB — URINE CULTURE: Culture: >=100000 — AB

## 2019-11-27 ENCOUNTER — Telehealth: Payer: Self-pay | Admitting: *Deleted

## 2019-11-27 NOTE — Telephone Encounter (Signed)
Post ED Visit - Positive Culture Follow-up  Culture report reviewed by antimicrobial stewardship pharmacist: [facility] Team []  Elenor Quinones, Pharm.D. []  Heide Guile, Pharm.D., BCPS AQ-ID []  Parks Neptune, Pharm.D., BCPS []  Alycia Rossetti, Pharm.D., BCPS []  [place], Pharm.D., BCPS, AAHIVP []  Legrand Como, Pharm.D., BCPS, AAHIVP []  Salome Arnt, PharmD, BCPS []  Johnnette Gourd, PharmD, BCPS []  Hughes Better, PharmD, BCPS []  Leeroy Cha, PharmD []  Laqueta Linden, PharmD, BCPS []  Albertina Parr, PharmD  [facility] Team []  Leodis Sias, PharmD []  Lindell Spar, PharmD []  Royetta Asal, PharmD []  Graylin Shiver, Rph []  Rema Fendt) Glennon Mac, PharmD []  Arlyn Dunning, PharmD []  Netta Cedars, PharmD []  Dia Sitter, PharmD []  Leone Haven, PharmD []  Gretta Arab, PharmD []  Theodis Shove, PharmD []  Peggyann Juba, PharmD []  Reuel Boom, PharmD   Positive urine culture No antibiotics needed and no further patient follow-up is required at this time. Wilson Singer, PharmD  Harlon Flor Talley 11/27/2019, 10:29 AM

## 2019-12-12 ENCOUNTER — Encounter: Payer: Self-pay | Admitting: Gastroenterology

## 2020-02-12 ENCOUNTER — Ambulatory Visit: Payer: PRIVATE HEALTH INSURANCE | Admitting: Gastroenterology

## 2020-10-21 DIAGNOSIS — I10 Essential (primary) hypertension: Secondary | ICD-10-CM | POA: Diagnosis not present

## 2020-10-21 DIAGNOSIS — E1169 Type 2 diabetes mellitus with other specified complication: Secondary | ICD-10-CM | POA: Diagnosis not present

## 2020-10-21 DIAGNOSIS — E785 Hyperlipidemia, unspecified: Secondary | ICD-10-CM | POA: Diagnosis not present

## 2020-10-25 DIAGNOSIS — E119 Type 2 diabetes mellitus without complications: Secondary | ICD-10-CM | POA: Diagnosis not present

## 2020-10-26 ENCOUNTER — Other Ambulatory Visit (HOSPITAL_COMMUNITY): Payer: Self-pay

## 2020-10-26 MED ORDER — IBUPROFEN 800 MG PO TABS
800.0000 mg | ORAL_TABLET | Freq: Three times a day (TID) | ORAL | 0 refills | Status: DC | PRN
  Filled 2020-10-26 – 2021-01-27 (×3): qty 60, 20d supply, fill #0

## 2020-10-26 MED ORDER — LOSARTAN POTASSIUM-HCTZ 50-12.5 MG PO TABS
1.0000 | ORAL_TABLET | Freq: Every day | ORAL | 1 refills | Status: DC
  Filled 2020-10-26 – 2020-11-18 (×3): qty 30, 30d supply, fill #0
  Filled 2020-12-06: qty 30, 30d supply, fill #1

## 2020-10-26 MED ORDER — ROSUVASTATIN CALCIUM 20 MG PO TABS
20.0000 mg | ORAL_TABLET | Freq: Every evening | ORAL | 0 refills | Status: DC
  Filled 2020-10-26 – 2021-04-29 (×4): qty 30, 30d supply, fill #0

## 2020-10-26 MED ORDER — METFORMIN HCL 1000 MG PO TABS
1000.0000 mg | ORAL_TABLET | Freq: Two times a day (BID) | ORAL | 1 refills | Status: AC
  Filled 2020-10-26 – 2020-10-27 (×2): qty 60, 30d supply, fill #0

## 2020-10-26 MED ORDER — GABAPENTIN 300 MG PO CAPS
300.0000 mg | ORAL_CAPSULE | Freq: Every day | ORAL | 1 refills | Status: DC
  Filled 2020-10-26 – 2020-11-22 (×3): qty 30, 30d supply, fill #0
  Filled 2020-12-06: qty 30, 30d supply, fill #1

## 2020-10-26 MED ORDER — ROSUVASTATIN CALCIUM 20 MG PO TABS
20.0000 mg | ORAL_TABLET | Freq: Every evening | ORAL | 1 refills | Status: DC
  Filled 2020-10-26 – 2020-11-18 (×3): qty 30, 30d supply, fill #0
  Filled 2020-12-06: qty 30, 30d supply, fill #1

## 2020-10-26 MED ORDER — FREESTYLE LIBRE 2 SENSOR MISC
10 refills | Status: AC
  Filled 2020-10-26 – 2020-10-27 (×3): qty 2, 28d supply, fill #0
  Filled 2020-11-18: qty 2, 28d supply, fill #1
  Filled 2020-12-13: qty 2, 28d supply, fill #2
  Filled 2021-01-03: qty 2, 28d supply, fill #3

## 2020-10-26 MED ORDER — OZEMPIC (0.25 OR 0.5 MG/DOSE) 2 MG/1.5ML ~~LOC~~ SOPN
0.2500 mg | PEN_INJECTOR | SUBCUTANEOUS | 0 refills | Status: AC
  Filled 2020-10-26 – 2020-10-27 (×3): qty 1.5, 56d supply, fill #0

## 2020-10-26 MED ORDER — LIRAGLUTIDE 18 MG/3ML ~~LOC~~ SOPN
1.2000 mg | PEN_INJECTOR | Freq: Every day | SUBCUTANEOUS | 2 refills | Status: AC
  Filled 2020-10-26: qty 6, 30d supply, fill #0
  Filled 2020-10-27: qty 3, 15d supply, fill #0

## 2020-10-27 ENCOUNTER — Other Ambulatory Visit (HOSPITAL_COMMUNITY): Payer: Self-pay

## 2020-10-27 MED ORDER — MOUNJARO 2.5 MG/0.5ML ~~LOC~~ SOAJ
2.5000 mg | SUBCUTANEOUS | 0 refills | Status: DC
  Filled 2020-10-27 – 2020-12-01 (×2): qty 2, 28d supply, fill #0

## 2020-11-04 ENCOUNTER — Other Ambulatory Visit (HOSPITAL_COMMUNITY): Payer: Self-pay

## 2020-11-18 ENCOUNTER — Other Ambulatory Visit (HOSPITAL_COMMUNITY): Payer: Self-pay

## 2020-11-22 ENCOUNTER — Other Ambulatory Visit (HOSPITAL_COMMUNITY): Payer: Self-pay

## 2020-12-01 ENCOUNTER — Other Ambulatory Visit (HOSPITAL_COMMUNITY): Payer: Self-pay

## 2020-12-06 ENCOUNTER — Other Ambulatory Visit (HOSPITAL_COMMUNITY): Payer: Self-pay

## 2020-12-06 MED ORDER — MOUNJARO 5 MG/0.5ML ~~LOC~~ SOAJ
5.0000 mg | SUBCUTANEOUS | 1 refills | Status: DC
  Filled 2021-01-04: qty 2, 28d supply, fill #0
  Filled 2021-01-27: qty 2, 28d supply, fill #1

## 2020-12-06 MED ORDER — MOUNJARO 2.5 MG/0.5ML ~~LOC~~ SOAJ
2.5000 mg | SUBCUTANEOUS | 0 refills | Status: AC
  Filled 2020-12-06: qty 2, 28d supply, fill #0

## 2020-12-07 ENCOUNTER — Other Ambulatory Visit (HOSPITAL_COMMUNITY): Payer: Self-pay

## 2020-12-13 ENCOUNTER — Other Ambulatory Visit (HOSPITAL_COMMUNITY): Payer: Self-pay

## 2021-01-03 ENCOUNTER — Other Ambulatory Visit (HOSPITAL_COMMUNITY): Payer: Self-pay

## 2021-01-04 ENCOUNTER — Other Ambulatory Visit (HOSPITAL_COMMUNITY): Payer: Self-pay

## 2021-01-05 ENCOUNTER — Other Ambulatory Visit (HOSPITAL_COMMUNITY): Payer: Self-pay

## 2021-01-05 MED ORDER — DEXCOM G6 RECEIVER DEVI
0 refills | Status: AC
  Filled 2021-03-24: qty 1, 90d supply, fill #0

## 2021-01-05 MED ORDER — DEXCOM G6 SENSOR MISC
11 refills | Status: AC
  Filled 2021-01-05 – 2021-02-15 (×4): qty 3, 30d supply, fill #0
  Filled 2021-03-24: qty 9, 90d supply, fill #0

## 2021-01-05 MED ORDER — DEXCOM G6 TRANSMITTER MISC
3 refills | Status: AC
  Filled 2021-01-05 – 2021-03-24 (×4): qty 1, 90d supply, fill #0

## 2021-01-12 ENCOUNTER — Other Ambulatory Visit (HOSPITAL_COMMUNITY): Payer: Self-pay

## 2021-01-27 ENCOUNTER — Other Ambulatory Visit (HOSPITAL_COMMUNITY): Payer: Self-pay

## 2021-01-28 ENCOUNTER — Other Ambulatory Visit (HOSPITAL_COMMUNITY): Payer: Self-pay

## 2021-01-28 MED ORDER — ROSUVASTATIN CALCIUM 20 MG PO TABS
20.0000 mg | ORAL_TABLET | Freq: Every evening | ORAL | 1 refills | Status: AC
  Filled 2021-01-28: qty 30, 30d supply, fill #0
  Filled 2021-03-24: qty 30, 30d supply, fill #1

## 2021-01-28 MED ORDER — GABAPENTIN 300 MG PO CAPS
300.0000 mg | ORAL_CAPSULE | Freq: Every day | ORAL | 1 refills | Status: DC
  Filled 2021-01-28: qty 30, 30d supply, fill #0
  Filled 2021-03-24: qty 30, 30d supply, fill #1

## 2021-01-28 MED ORDER — LOSARTAN POTASSIUM-HCTZ 50-12.5 MG PO TABS
1.0000 | ORAL_TABLET | Freq: Every day | ORAL | 1 refills | Status: DC
  Filled 2021-01-28: qty 30, 30d supply, fill #0
  Filled 2021-03-24: qty 30, 30d supply, fill #1

## 2021-02-02 ENCOUNTER — Other Ambulatory Visit (HOSPITAL_COMMUNITY): Payer: Self-pay

## 2021-02-15 ENCOUNTER — Other Ambulatory Visit (HOSPITAL_COMMUNITY): Payer: Self-pay

## 2021-03-24 ENCOUNTER — Other Ambulatory Visit (HOSPITAL_COMMUNITY): Payer: Self-pay

## 2021-03-25 ENCOUNTER — Other Ambulatory Visit (HOSPITAL_COMMUNITY): Payer: Self-pay

## 2021-03-25 MED ORDER — MOUNJARO 7.5 MG/0.5ML ~~LOC~~ SOAJ
7.5000 mg | SUBCUTANEOUS | 1 refills | Status: DC
  Filled 2021-03-25: qty 2, 28d supply, fill #0
  Filled 2021-04-29: qty 2, 28d supply, fill #1

## 2021-03-25 MED ORDER — FREESTYLE LITE TEST VI STRP
1.0000 | ORAL_STRIP | Freq: Two times a day (BID) | 3 refills | Status: AC
  Filled 2021-03-25: qty 100, 50d supply, fill #0

## 2021-03-25 MED ORDER — FREESTYLE LITE W/DEVICE KIT
PACK | 0 refills | Status: AC
  Filled 2021-03-25: qty 1, 1d supply, fill #0

## 2021-03-28 ENCOUNTER — Other Ambulatory Visit (HOSPITAL_COMMUNITY): Payer: Self-pay

## 2021-04-20 ENCOUNTER — Other Ambulatory Visit (HOSPITAL_COMMUNITY): Payer: Self-pay

## 2021-04-20 DIAGNOSIS — Z8371 Family history of colonic polyps: Secondary | ICD-10-CM | POA: Diagnosis not present

## 2021-04-20 DIAGNOSIS — E1169 Type 2 diabetes mellitus with other specified complication: Secondary | ICD-10-CM | POA: Diagnosis not present

## 2021-04-20 DIAGNOSIS — Z0000 Encounter for general adult medical examination without abnormal findings: Secondary | ICD-10-CM | POA: Diagnosis not present

## 2021-04-20 DIAGNOSIS — Z8249 Family history of ischemic heart disease and other diseases of the circulatory system: Secondary | ICD-10-CM | POA: Diagnosis not present

## 2021-04-20 DIAGNOSIS — F17210 Nicotine dependence, cigarettes, uncomplicated: Secondary | ICD-10-CM | POA: Diagnosis not present

## 2021-04-20 DIAGNOSIS — I10 Essential (primary) hypertension: Secondary | ICD-10-CM | POA: Diagnosis not present

## 2021-04-20 DIAGNOSIS — Z0001 Encounter for general adult medical examination with abnormal findings: Secondary | ICD-10-CM | POA: Diagnosis not present

## 2021-04-20 DIAGNOSIS — Z862 Personal history of diseases of the blood and blood-forming organs and certain disorders involving the immune mechanism: Secondary | ICD-10-CM | POA: Diagnosis not present

## 2021-04-20 DIAGNOSIS — E119 Type 2 diabetes mellitus without complications: Secondary | ICD-10-CM | POA: Diagnosis not present

## 2021-04-20 DIAGNOSIS — Z1231 Encounter for screening mammogram for malignant neoplasm of breast: Secondary | ICD-10-CM | POA: Diagnosis not present

## 2021-04-20 DIAGNOSIS — E782 Mixed hyperlipidemia: Secondary | ICD-10-CM | POA: Diagnosis not present

## 2021-04-20 MED ORDER — FREESTYLE LIBRE 3 SENSOR MISC
11 refills | Status: AC
  Filled 2021-04-20: qty 6, 84d supply, fill #0
  Filled 2021-04-29: qty 1, 14d supply, fill #0
  Filled 2021-07-28: qty 2, 28d supply, fill #1
  Filled 2021-07-28: qty 1, 14d supply, fill #1

## 2021-04-20 MED ORDER — GABAPENTIN 300 MG PO CAPS
600.0000 mg | ORAL_CAPSULE | Freq: Every day | ORAL | 1 refills | Status: AC
  Filled 2021-04-20 – 2021-04-29 (×2): qty 180, 90d supply, fill #0
  Filled 2022-01-27: qty 180, 90d supply, fill #1

## 2021-04-29 ENCOUNTER — Other Ambulatory Visit (HOSPITAL_COMMUNITY): Payer: Self-pay

## 2021-04-29 MED ORDER — LOSARTAN POTASSIUM-HCTZ 50-12.5 MG PO TABS
1.0000 | ORAL_TABLET | Freq: Every day | ORAL | 1 refills | Status: AC
  Filled 2021-04-29: qty 30, 30d supply, fill #0

## 2021-04-29 MED ORDER — MOUNJARO 12.5 MG/0.5ML ~~LOC~~ SOAJ
12.5000 mg | SUBCUTANEOUS | 0 refills | Status: DC
  Filled 2021-04-29: qty 2, 28d supply, fill #0

## 2021-05-04 ENCOUNTER — Other Ambulatory Visit (HOSPITAL_COMMUNITY): Payer: Self-pay

## 2021-05-31 ENCOUNTER — Other Ambulatory Visit (HOSPITAL_COMMUNITY): Payer: Self-pay

## 2021-06-01 ENCOUNTER — Other Ambulatory Visit (HOSPITAL_COMMUNITY): Payer: Self-pay

## 2021-06-01 MED ORDER — MOUNJARO 12.5 MG/0.5ML ~~LOC~~ SOAJ
12.5000 mg | SUBCUTANEOUS | 0 refills | Status: DC
  Filled 2021-06-01: qty 2, 28d supply, fill #0

## 2021-06-23 ENCOUNTER — Other Ambulatory Visit (HOSPITAL_COMMUNITY): Payer: Self-pay

## 2021-06-23 DIAGNOSIS — Z716 Tobacco abuse counseling: Secondary | ICD-10-CM | POA: Diagnosis not present

## 2021-06-23 DIAGNOSIS — R0989 Other specified symptoms and signs involving the circulatory and respiratory systems: Secondary | ICD-10-CM | POA: Diagnosis not present

## 2021-06-23 DIAGNOSIS — E782 Mixed hyperlipidemia: Secondary | ICD-10-CM | POA: Diagnosis not present

## 2021-06-23 DIAGNOSIS — E1142 Type 2 diabetes mellitus with diabetic polyneuropathy: Secondary | ICD-10-CM | POA: Diagnosis not present

## 2021-06-23 DIAGNOSIS — Z794 Long term (current) use of insulin: Secondary | ICD-10-CM | POA: Diagnosis not present

## 2021-06-23 DIAGNOSIS — E1340 Other specified diabetes mellitus with diabetic neuropathy, unspecified: Secondary | ICD-10-CM | POA: Diagnosis not present

## 2021-06-23 DIAGNOSIS — I10 Essential (primary) hypertension: Secondary | ICD-10-CM | POA: Diagnosis not present

## 2021-06-23 MED ORDER — ROSUVASTATIN CALCIUM 20 MG PO TABS
20.0000 mg | ORAL_TABLET | Freq: Every day | ORAL | 2 refills | Status: AC
Start: 2021-06-23 — End: ?
  Filled 2021-06-23: qty 90, 90d supply, fill #0

## 2021-06-23 MED ORDER — MOUNJARO 10 MG/0.5ML ~~LOC~~ SOAJ
10.0000 mg | SUBCUTANEOUS | 3 refills | Status: DC
  Filled 2021-06-23: qty 2, 28d supply, fill #0
  Filled 2021-07-28: qty 2, 28d supply, fill #1
  Filled 2021-08-26: qty 2, 28d supply, fill #2
  Filled 2021-09-28: qty 2, 28d supply, fill #3

## 2021-06-23 MED ORDER — GABAPENTIN 300 MG PO CAPS
600.0000 mg | ORAL_CAPSULE | Freq: Every day | ORAL | 1 refills | Status: DC
  Filled 2021-06-23 – 2021-07-28 (×2): qty 180, 90d supply, fill #0
  Filled 2021-10-25: qty 180, 90d supply, fill #1

## 2021-06-23 MED ORDER — LOSARTAN POTASSIUM-HCTZ 50-12.5 MG PO TABS
1.0000 | ORAL_TABLET | Freq: Every day | ORAL | 2 refills | Status: DC
Start: 2021-06-23 — End: 2022-05-15
  Filled 2021-06-23: qty 90, 90d supply, fill #0
  Filled 2021-09-28: qty 90, 90d supply, fill #1
  Filled 2022-01-10 (×2): qty 90, 90d supply, fill #2

## 2021-07-14 ENCOUNTER — Other Ambulatory Visit (HOSPITAL_COMMUNITY): Payer: Self-pay

## 2021-07-21 DIAGNOSIS — E669 Obesity, unspecified: Secondary | ICD-10-CM | POA: Diagnosis not present

## 2021-07-21 DIAGNOSIS — Z720 Tobacco use: Secondary | ICD-10-CM | POA: Diagnosis not present

## 2021-07-21 DIAGNOSIS — I6523 Occlusion and stenosis of bilateral carotid arteries: Secondary | ICD-10-CM | POA: Diagnosis not present

## 2021-07-21 DIAGNOSIS — E119 Type 2 diabetes mellitus without complications: Secondary | ICD-10-CM | POA: Diagnosis not present

## 2021-07-21 DIAGNOSIS — E785 Hyperlipidemia, unspecified: Secondary | ICD-10-CM | POA: Diagnosis not present

## 2021-07-21 DIAGNOSIS — I10 Essential (primary) hypertension: Secondary | ICD-10-CM | POA: Diagnosis not present

## 2021-07-28 ENCOUNTER — Other Ambulatory Visit (HOSPITAL_COMMUNITY): Payer: Self-pay

## 2021-07-28 MED ORDER — ROSUVASTATIN CALCIUM 20 MG PO TABS
20.0000 mg | ORAL_TABLET | Freq: Every day | ORAL | 2 refills | Status: AC
  Filled 2021-07-28: qty 90, 90d supply, fill #0

## 2021-07-28 MED ORDER — ROSUVASTATIN CALCIUM 20 MG PO TABS
40.0000 mg | ORAL_TABLET | Freq: Every day | ORAL | 2 refills | Status: AC
  Filled 2021-07-28 – 2021-09-28 (×2): qty 90, 45d supply, fill #0

## 2021-07-28 MED ORDER — IBUPROFEN 800 MG PO TABS
800.0000 mg | ORAL_TABLET | Freq: Three times a day (TID) | ORAL | 0 refills | Status: DC | PRN
Start: 2021-07-28 — End: 2022-03-07
  Filled 2021-07-28: qty 60, 20d supply, fill #0

## 2021-07-28 MED ORDER — ROSUVASTATIN CALCIUM 40 MG PO TABS
40.0000 mg | ORAL_TABLET | Freq: Every day | ORAL | 3 refills | Status: AC
  Filled 2021-07-28 – 2022-01-10 (×5): qty 90, 90d supply, fill #0

## 2021-07-29 ENCOUNTER — Other Ambulatory Visit (HOSPITAL_COMMUNITY): Payer: Self-pay

## 2021-08-01 ENCOUNTER — Other Ambulatory Visit (HOSPITAL_COMMUNITY): Payer: Self-pay

## 2021-08-03 ENCOUNTER — Other Ambulatory Visit (HOSPITAL_COMMUNITY): Payer: Self-pay

## 2021-08-26 ENCOUNTER — Other Ambulatory Visit (HOSPITAL_COMMUNITY): Payer: Self-pay

## 2021-09-28 ENCOUNTER — Other Ambulatory Visit (HOSPITAL_COMMUNITY): Payer: Self-pay

## 2021-09-28 MED ORDER — ROSUVASTATIN CALCIUM 40 MG PO TABS
40.0000 mg | ORAL_TABLET | Freq: Every day | ORAL | 3 refills | Status: DC
  Filled 2021-09-28: qty 90, 90d supply, fill #0
  Filled 2022-05-15: qty 90, 90d supply, fill #1
  Filled 2022-08-07: qty 90, 90d supply, fill #2

## 2021-09-29 ENCOUNTER — Other Ambulatory Visit (HOSPITAL_COMMUNITY): Payer: Self-pay

## 2021-10-25 ENCOUNTER — Other Ambulatory Visit (HOSPITAL_COMMUNITY): Payer: Self-pay

## 2021-10-25 MED ORDER — MOUNJARO 10 MG/0.5ML ~~LOC~~ SOAJ
10.0000 mg | SUBCUTANEOUS | 3 refills | Status: AC
  Filled 2021-10-25 – 2021-12-08 (×2): qty 2, 28d supply, fill #0
  Filled 2022-01-10: qty 2, 28d supply, fill #1

## 2021-10-26 ENCOUNTER — Other Ambulatory Visit (HOSPITAL_COMMUNITY): Payer: Self-pay

## 2021-10-26 MED ORDER — MOUNJARO 12.5 MG/0.5ML ~~LOC~~ SOAJ
12.5000 mg | SUBCUTANEOUS | 1 refills | Status: AC
  Filled 2021-10-26 (×3): qty 2, 28d supply, fill #0

## 2021-10-28 ENCOUNTER — Other Ambulatory Visit (HOSPITAL_COMMUNITY): Payer: Self-pay

## 2021-11-03 DIAGNOSIS — H5213 Myopia, bilateral: Secondary | ICD-10-CM | POA: Diagnosis not present

## 2021-11-03 DIAGNOSIS — H52203 Unspecified astigmatism, bilateral: Secondary | ICD-10-CM | POA: Diagnosis not present

## 2021-11-03 DIAGNOSIS — E119 Type 2 diabetes mellitus without complications: Secondary | ICD-10-CM | POA: Diagnosis not present

## 2021-11-03 DIAGNOSIS — H524 Presbyopia: Secondary | ICD-10-CM | POA: Diagnosis not present

## 2021-12-06 DIAGNOSIS — D124 Benign neoplasm of descending colon: Secondary | ICD-10-CM | POA: Diagnosis not present

## 2021-12-06 DIAGNOSIS — Z1211 Encounter for screening for malignant neoplasm of colon: Secondary | ICD-10-CM | POA: Diagnosis not present

## 2021-12-06 DIAGNOSIS — D123 Benign neoplasm of transverse colon: Secondary | ICD-10-CM | POA: Diagnosis not present

## 2021-12-06 DIAGNOSIS — K648 Other hemorrhoids: Secondary | ICD-10-CM | POA: Diagnosis not present

## 2021-12-06 DIAGNOSIS — Z83719 Family history of colon polyps, unspecified: Secondary | ICD-10-CM | POA: Diagnosis not present

## 2021-12-08 ENCOUNTER — Other Ambulatory Visit (HOSPITAL_COMMUNITY): Payer: Self-pay

## 2021-12-23 ENCOUNTER — Other Ambulatory Visit (HOSPITAL_COMMUNITY): Payer: Self-pay

## 2021-12-23 DIAGNOSIS — E782 Mixed hyperlipidemia: Secondary | ICD-10-CM | POA: Diagnosis not present

## 2021-12-23 DIAGNOSIS — I10 Essential (primary) hypertension: Secondary | ICD-10-CM | POA: Diagnosis not present

## 2021-12-23 DIAGNOSIS — E1142 Type 2 diabetes mellitus with diabetic polyneuropathy: Secondary | ICD-10-CM | POA: Diagnosis not present

## 2021-12-23 MED ORDER — MOUNJARO 7.5 MG/0.5ML ~~LOC~~ SOAJ
7.5000 mg | SUBCUTANEOUS | 0 refills | Status: DC
  Filled 2021-12-23: qty 6, 84d supply, fill #0
  Filled 2022-01-10 (×2): qty 2, 28d supply, fill #0
  Filled 2022-01-27: qty 2, 28d supply, fill #1
  Filled 2022-03-07: qty 2, 28d supply, fill #2

## 2022-01-10 ENCOUNTER — Other Ambulatory Visit (HOSPITAL_COMMUNITY): Payer: Self-pay

## 2022-01-11 ENCOUNTER — Other Ambulatory Visit (HOSPITAL_COMMUNITY): Payer: Self-pay

## 2022-01-27 ENCOUNTER — Other Ambulatory Visit (HOSPITAL_COMMUNITY): Payer: Self-pay

## 2022-02-02 ENCOUNTER — Other Ambulatory Visit (HOSPITAL_COMMUNITY): Payer: Self-pay

## 2022-03-07 ENCOUNTER — Other Ambulatory Visit (HOSPITAL_COMMUNITY): Payer: Self-pay

## 2022-03-07 MED ORDER — IBUPROFEN 800 MG PO TABS
800.0000 mg | ORAL_TABLET | Freq: Three times a day (TID) | ORAL | 0 refills | Status: DC | PRN
  Filled 2022-03-07: qty 60, 20d supply, fill #0

## 2022-04-03 DIAGNOSIS — I10 Essential (primary) hypertension: Secondary | ICD-10-CM | POA: Diagnosis not present

## 2022-04-03 DIAGNOSIS — E782 Mixed hyperlipidemia: Secondary | ICD-10-CM | POA: Diagnosis not present

## 2022-04-03 DIAGNOSIS — M79672 Pain in left foot: Secondary | ICD-10-CM | POA: Diagnosis not present

## 2022-04-03 DIAGNOSIS — E1142 Type 2 diabetes mellitus with diabetic polyneuropathy: Secondary | ICD-10-CM | POA: Diagnosis not present

## 2022-04-10 ENCOUNTER — Encounter (HOSPITAL_BASED_OUTPATIENT_CLINIC_OR_DEPARTMENT_OTHER): Payer: Self-pay

## 2022-04-10 ENCOUNTER — Emergency Department (HOSPITAL_BASED_OUTPATIENT_CLINIC_OR_DEPARTMENT_OTHER)
Admission: EM | Admit: 2022-04-10 | Discharge: 2022-04-10 | Disposition: A | Discharge status: Home / Self Care | Payer: Commercial Managed Care - PPO | Attending: Emergency Medicine | Admitting: Emergency Medicine

## 2022-04-10 ENCOUNTER — Other Ambulatory Visit (HOSPITAL_COMMUNITY): Payer: Self-pay

## 2022-04-10 DIAGNOSIS — Z7984 Long term (current) use of oral hypoglycemic drugs: Secondary | ICD-10-CM | POA: Diagnosis not present

## 2022-04-10 DIAGNOSIS — E876 Hypokalemia: Secondary | ICD-10-CM | POA: Diagnosis not present

## 2022-04-10 DIAGNOSIS — Z794 Long term (current) use of insulin: Secondary | ICD-10-CM | POA: Insufficient documentation

## 2022-04-10 DIAGNOSIS — K2900 Acute gastritis without bleeding: Secondary | ICD-10-CM | POA: Diagnosis not present

## 2022-04-10 DIAGNOSIS — R1013 Epigastric pain: Secondary | ICD-10-CM | POA: Diagnosis present

## 2022-04-10 DIAGNOSIS — E119 Type 2 diabetes mellitus without complications: Secondary | ICD-10-CM | POA: Diagnosis not present

## 2022-04-10 DIAGNOSIS — N390 Urinary tract infection, site not specified: Secondary | ICD-10-CM | POA: Diagnosis not present

## 2022-04-10 LAB — URINALYSIS, ROUTINE W REFLEX MICROSCOPIC
Glucose, UA: NEGATIVE mg/dL
Ketones, ur: NEGATIVE mg/dL
Nitrite: POSITIVE — AB
Protein, ur: >=300 mg/dL — AB
Specific Gravity, Urine: 1.02 (ref 1.005–1.030)
pH: 5.5 (ref 5.0–8.0)

## 2022-04-10 LAB — COMPREHENSIVE METABOLIC PANEL
ALT: 21 U/L (ref 0–44)
AST: 25 U/L (ref 15–41)
Albumin: 3.8 g/dL (ref 3.5–5.0)
Alkaline Phosphatase: 48 U/L (ref 38–126)
Anion gap: 4 — ABNORMAL LOW (ref 5–15)
BUN: 23 mg/dL — ABNORMAL HIGH (ref 6–20)
CO2: 26 mmol/L (ref 22–32)
Calcium: 8.8 mg/dL — ABNORMAL LOW (ref 8.9–10.3)
Chloride: 102 mmol/L (ref 98–111)
Creatinine, Ser: 1.3 mg/dL — ABNORMAL HIGH (ref 0.44–1.00)
GFR, Estimated: 52 mL/min — ABNORMAL LOW (ref >60)
Glucose, Bld: 97 mg/dL (ref 70–99)
Potassium: 3.3 mmol/L — ABNORMAL LOW (ref 3.5–5.1)
Sodium: 132 mmol/L — ABNORMAL LOW (ref 135–145)
Total Bilirubin: 0.7 mg/dL (ref 0.3–1.2)
Total Protein: 7.7 g/dL (ref 6.5–8.1)

## 2022-04-10 LAB — PREGNANCY, URINE: Preg Test, Ur: NEGATIVE

## 2022-04-10 LAB — CBC
HCT: 35.8 % — ABNORMAL LOW (ref 36.0–46.0)
Hemoglobin: 11.9 g/dL — ABNORMAL LOW (ref 12.0–15.0)
MCH: 27.7 pg (ref 26.0–34.0)
MCHC: 33.2 g/dL (ref 30.0–36.0)
MCV: 83.3 fL (ref 80.0–100.0)
Platelets: 366 10*3/uL (ref 150–400)
RBC: 4.3 MIL/uL (ref 3.87–5.11)
RDW: 15.1 % (ref 11.5–15.5)
WBC: 9.7 10*3/uL (ref 4.0–10.5)
nRBC: 0 % (ref 0.0–0.2)

## 2022-04-10 LAB — MAGNESIUM: Magnesium: 2 mg/dL (ref 1.7–2.4)

## 2022-04-10 LAB — LIPASE, BLOOD: Lipase: 29 U/L (ref 11–51)

## 2022-04-10 LAB — URINALYSIS, MICROSCOPIC (REFLEX)

## 2022-04-10 MED ORDER — CEPHALEXIN 500 MG PO CAPS
500.0000 mg | ORAL_CAPSULE | Freq: Two times a day (BID) | ORAL | 0 refills | Status: AC
  Filled 2022-04-10: qty 18, 9d supply, fill #0

## 2022-04-10 MED ORDER — SODIUM CHLORIDE 0.9 % IV SOLN
1.0000 g | Freq: Once | INTRAVENOUS | Status: AC
  Administered 2022-04-10: 1 g via INTRAVENOUS
  Filled 2022-04-10: qty 10

## 2022-04-10 MED ORDER — ONDANSETRON HCL 4 MG PO TABS
4.0000 mg | ORAL_TABLET | Freq: Four times a day (QID) | ORAL | 0 refills | Status: AC
  Filled 2022-04-10: qty 10, 3d supply, fill #0

## 2022-04-10 MED ORDER — DROPERIDOL 2.5 MG/ML IJ SOLN
1.2500 mg | Freq: Once | INTRAMUSCULAR | Status: AC
  Administered 2022-04-10: 1.25 mg via INTRAVENOUS
  Filled 2022-04-10: qty 2

## 2022-04-10 MED ORDER — ALUM & MAG HYDROXIDE-SIMETH 200-200-20 MG/5ML PO SUSP
15.0000 mL | Freq: Once | ORAL | Status: AC
  Administered 2022-04-10: 15 mL via ORAL
  Filled 2022-04-10: qty 30

## 2022-04-10 MED ORDER — FAMOTIDINE IN NACL 20-0.9 MG/50ML-% IV SOLN
20.0000 mg | Freq: Once | INTRAVENOUS | Status: AC
  Administered 2022-04-10: 20 mg via INTRAVENOUS
  Filled 2022-04-10: qty 50

## 2022-04-10 MED ORDER — SODIUM CHLORIDE 0.9 % IV BOLUS
1000.0000 mL | Freq: Once | INTRAVENOUS | Status: AC
  Administered 2022-04-10: 1000 mL via INTRAVENOUS

## 2022-04-10 NOTE — ED Triage Notes (Signed)
C/o epigastric pain since Saturday with nausea/vomiting. Unable to tolerate PO.

## 2022-04-10 NOTE — ED Notes (Signed)
Discharge instructions reviewed with patient. Patient verbalizes understanding, no further questions at this time. Medications/prescriptions and follow up information provided. No acute distress noted at time of departure.  

## 2022-04-10 NOTE — ED Provider Notes (Signed)
[facility] Provider Note   CSN: [id number] Arrival date & time: 04/10/22  0908     History  Chief Complaint  Patient presents with   Abdominal Pain    Traci Butler is a 47 y.o. female with a history of diabetes presenting to the ED with epigastric pain, nausea and vomiting.  The patient reports that she was celebrating her birthday on Saturday and drank alcohol, significantly more than normal, and woke up feeling nauseated with epigastric pain, and was not able to finish her rounds at work today.  She has had some vomiting cannot keep down any fluids.  She had a similar episode 2 years ago in 2021 for which she came to the ED, at that time had a negative CT scan of the abdomen and a normal gallbladder ultrasound and says she felt better with antiemetics.  She reports he took a warm shower seem to help her symptoms.  She does smoke marijuana occasionally but not daily.  She reports a history of a right ovarian cyst and ovary removal, complicated by urethral injury and needing reconstruction of her right urethra and kidney.  No other abdominal surgeries reported.  HPI     Home Medications Prior to Admission medications   Medication Sig Start Date End Date Taking? Authorizing Provider  cephALEXin (KEFLEX) 500 MG capsule Take 1 capsule (500 mg total) by mouth 2 (two) times daily for 9 days. 04/11/22 04/20/22 Yes Ota Ebersole, Carola Rhine, MD  ondansetron (ZOFRAN) 4 MG tablet Take 1 tablet (4 mg total) by mouth every 6 (six) hours for 10 doses. 04/10/22 04/13/22 Yes Quaneisha Hanisch, Carola Rhine, MD  BAYER MICROLET LANCETS lancets Use as instructed two times daily 08/21/16   Axel Filler, MD  Blood Glucose Monitoring Suppl (CONTOUR NEXT ONE) KIT 1 each by Does not apply route 2 (two) times daily. 08/21/16   Axel Filler, MD  Blood Glucose Monitoring Suppl (FREESTYLE LITE) w/Device KIT Use as directed. 03/25/21     Continuous Blood Gluc Receiver  (DEXCOM G6 RECEIVER) DEVI Use as directed for continuous glucose monitoring. 01/05/21     Continuous Blood Gluc Sensor (DEXCOM G6 SENSOR) MISC Inject 1 sensor to the skin every 10 days for continuous glucose monitoring. 01/05/21     Continuous Blood Gluc Sensor (FREESTYLE LIBRE 2 SENSOR) MISC Apply 1 sensor to the skin every 14 days for continuous glucose monitoring 10/21/20     Continuous Blood Gluc Sensor (FREESTYLE LIBRE 3 SENSOR) MISC Inject 1 sensor to the skin every 14 days for continuous glucose monitoring. 04/20/21     Continuous Blood Gluc Transmit (DEXCOM G6 TRANSMITTER) MISC Use as directed for continuous glucose monitoring. Reuse transmitter for 90 days then discard and replace. 01/05/21     gabapentin (NEURONTIN) 300 MG capsule Take 1 capsule (300 mg total) by mouth at bedtime. 12/25/17   Aldine Contes, MD  gabapentin (NEURONTIN) 300 MG capsule Take 2 capsules (600 mg total) by mouth daily. 04/20/21     gabapentin (NEURONTIN) 300 MG capsule Take 2 capsules (600 mg total) by mouth daily. 06/23/21   Park Meo T, PA-C  glucose blood (CONTOUR NEXT TEST) test strip Use as instructed two times daily 08/21/16   Axel Filler, MD  glucose blood (FREESTYLE LITE) test strip Use to check blood sugar twice daily. 03/25/21     ibuprofen (ADVIL) 800 MG tablet Take 1 tablet (800 mg total) by mouth every 8 (eight) hours as needed for  pain 03/07/22     Insulin Pen Needle (PEN NEEDLES) 32G X 4 MM MISC 1 each by Does not apply route daily. 10/19/16   Aldine Contes, MD  liraglutide (VICTOZA) 18 MG/3ML SOPN Inject 0.2 mLs (1.2 mg total) into the skin daily. 12/25/17   Aldine Contes, MD  liraglutide (VICTOZA) 18 MG/3ML SOPN Inject 1.2 mg into the skin daily. 10/13/20     losartan-hydrochlorothiazide (HYZAAR) 50-12.5 MG tablet Take 1 tablet by mouth daily. 12/25/17   Aldine Contes, MD  losartan-hydrochlorothiazide (HYZAAR) 50-12.5 MG tablet Take 1 tablet by mouth daily. 04/29/21      losartan-hydrochlorothiazide (HYZAAR) 50-12.5 MG tablet Take 1 tablet by mouth daily. 06/23/21     metFORMIN (GLUCOPHAGE) 1000 MG tablet Take 1 tablet (1,000 mg total) by mouth 2 (two) times daily with a meal. 02/19/18   Aldine Contes, MD  metFORMIN (GLUCOPHAGE) 1000 MG tablet Take 1 tablet (1,000 mg total) by mouth 2 (two) times daily with a meal. 10/13/20     ondansetron (ZOFRAN ODT) 4 MG disintegrating tablet Take 1 tablet (4 mg total) by mouth every 8 (eight) hours as needed for nausea or vomiting. 11/24/19   Alfredia Client, PA-C  rosuvastatin (CRESTOR) 20 MG tablet TAKE 1 TABLET BY MOUTH EVERY DAY 04/29/18   Aldine Contes, MD  rosuvastatin (CRESTOR) 20 MG tablet Take 1 tablet (20 mg total) by mouth every evening. 01/28/21     rosuvastatin (CRESTOR) 20 MG tablet Take 1 tablet (20 mg total) by mouth daily. 06/23/21     rosuvastatin (CRESTOR) 20 MG tablet Take 1 tablet (20 mg total) by mouth daily. 07/28/21     rosuvastatin (CRESTOR) 20 MG tablet Take 2 tablets (40 mg total) by mouth daily. 07/28/21     rosuvastatin (CRESTOR) 40 MG tablet Take 1 tablet (40 mg total) by mouth daily. 07/28/21     rosuvastatin (CRESTOR) 40 MG tablet Take 1 tablet (40 mg total) by mouth daily. 09/28/21     Semaglutide,0.25 or 0.5MG /DOS, (OZEMPIC, 0.25 OR 0.5 MG/DOSE,) 2 MG/1.5ML SOPN Inject 0.25 mg into the skin once a week. 10/25/20     tirzepatide (MOUNJARO) 10 MG/0.5ML Pen Inject 10 mg into the skin every 7 (seven) days. 10/25/21     tirzepatide (MOUNJARO) 12.5 MG/0.5ML Pen Inject 12.5 mg into the skin every 7 days 10/26/21     tirzepatide [facility]) 2.5 MG/0.5ML Pen Inject 2.5 mg into the skin once a week. 12/06/20     tirzepatide (MOUNJARO) 7.5 MG/0.5ML Pen Inject 7.5 mg into the skin every 7 (seven) days. 12/23/21         Allergies    Patient has no known allergies.    Review of Systems   Review of Systems  Physical Exam Updated Vital Signs BP (!) 129/96   Pulse 67   Temp 98.7 F (37.1 C) (Oral)   Resp 15   Ht  5\' 6"  (1.676 m)   Wt 78 kg   LMP 04/06/2022 (Approximate)   SpO2 100%   BMI 27.76 kg/m  Physical Exam Constitutional:      General: She is not in acute distress. HENT:     Head: Normocephalic and atraumatic.  Eyes:     Conjunctiva/sclera: Conjunctivae normal.     Pupils: Pupils are equal, round, and reactive to light.  Cardiovascular:     Rate and Rhythm: Normal rate and regular rhythm.  Pulmonary:     Effort: Pulmonary effort is normal. No respiratory distress.  Abdominal:     General:  There is no distension.     Tenderness: There is abdominal tenderness in the epigastric area.  Skin:    General: Skin is warm and dry.  Neurological:     General: No focal deficit present.     Mental Status: She is alert. Mental status is at baseline.  Psychiatric:        Mood and Affect: Mood normal.        Behavior: Behavior normal.     ED Results / Procedures / Treatments   Labs (all labs ordered are listed, but only abnormal results are displayed) Labs Reviewed  COMPREHENSIVE METABOLIC PANEL - Abnormal; Notable for the following components:      Result Value   Sodium 132 (*)    Potassium 3.3 (*)    BUN 23 (*)    Creatinine, Ser 1.30 (*)    Calcium 8.8 (*)    GFR, Estimated 52 (*)    Anion gap 4 (*)    All other components within normal limits  CBC - Abnormal; Notable for the following components:   Hemoglobin 11.9 (*)    HCT 35.8 (*)    All other components within normal limits  URINALYSIS, ROUTINE W REFLEX MICROSCOPIC - Abnormal; Notable for the following components:   APPearance CLOUDY (*)    Hgb urine dipstick LARGE (*)    Bilirubin Urine SMALL (*)    Protein, ur >=300 (*)    Nitrite POSITIVE (*)    Leukocytes,Ua SMALL (*)    All other components within normal limits  URINALYSIS, MICROSCOPIC (REFLEX) - Abnormal; Notable for the following components:   Bacteria, UA MANY (*)    All other components within normal limits  URINE CULTURE  LIPASE, BLOOD  PREGNANCY, URINE   MAGNESIUM    EKG EKG Interpretation  Date/Time:  Monday April 10 2022 09:23:32 EDT Ventricular Rate:  65 PR Interval:  211 QRS Duration: 87 QT Interval:  402 QTC Calculation: 418 R Axis:   75 Text Interpretation: Sinus rhythm Ventricular trigeminy Prolonged PR interval Confirmed by Octaviano Glow [phone]) on 04/10/2022 9:38:12 AM  Radiology No results found.  Procedures Procedures    Medications Ordered in ED Medications  famotidine (PEPCID) IVPB 20 mg premix (0 mg Intravenous Stopped 04/10/22 1023)  alum & mag hydroxide-simeth (MAALOX/MYLANTA) 200-200-20 MG/5ML suspension 15 mL (15 mLs Oral Given 04/10/22 0953)  droperidol (INAPSINE) 2.5 MG/ML injection 1.25 mg (1.25 mg Intravenous Given 04/10/22 0950)  sodium chloride 0.9 % bolus 1,000 mL (0 mLs Intravenous Stopped 04/10/22 1058)  cefTRIAXone (ROCEPHIN) 1 g in sodium chloride 0.9 % 100 mL IVPB (0 g Intravenous Stopped 04/10/22 1055)    ED Course/ Medical Decision Making/ A&P Clinical Course as of 04/10/22 1327  Mon Apr 10, 2022  0955 We discussed the patient's UA findings with her.  She is not having active symptoms of UTI but says she has never had symptoms of a UTI in the past.  She does report she has been having right-sided flank pain for about 3 days.  I think is reasonable to treat for potential urinary tract infection with these findings, including the cloudy urine.  There are several squamous cells of this may be contaminant but difficult to exclude this possibility.  Will treat with Rocephin here.  Her white blood cell count is normal and she does not show SIRS criteria concerning for sepsis [MT]  1143 Patient is feeling much better after GI medications.  Explained to her and her husband this may be an  issue of gastritis from alcohol drinking versus a possible UTI.  We will treat for urinary infection and possible kidney infection.  But she is stable for discharge and ready to leave [MT]    Clinical Course User Index [MT]  Carnella Fryman, Carola Rhine, MD                             Medical Decision Making Amount and/or Complexity of Data Reviewed Labs: ordered.  Risk OTC drugs. Prescription drug management.   This patient presents to the ED with concern for epigastric pain, nausea, vomiting. This involves an extensive number of treatment options, and is a complaint that carries with it a high risk of complications and morbidity.  The differential diagnosis includes gastroparesis versus cannabis hyperemesis syndrome versus gastritis versus pancreatitis versus biliary disease versus other  Co-morbidities that complicate the patient evaluation: Marijuana use increases risk of cannabis hyperemesis syndrome  I ordered and personally interpreted labs.  The pertinent results include: Very mild hypokalemia, otherwise no emergent findings  The patient was maintained on a cardiac monitor.  I personally viewed and interpreted the cardiac monitored which showed an underlying rhythm of: Sinus rhythm with occasional PVCs  Per my interpretation the patient's ECG shows sinus rhythm with occasional PVCs, Qtc normal 418  I ordered medication including IV droperidol, IV Pepcid, Maalox, fluids for suspected gastritis potential gastroparesis or cannabis hyperemesis syndrome.  I have reviewed the patients home medicines and have made adjustments as needed  Test Considered: Low suspicion for intra-abdominal surgical emergency and did not see an indication for CT scanning of the abdomen at this time.   After the interventions noted above, I reevaluated the patient and found that they have: improved  Social Determinants of Health: counseled patient about cessation of marijuana use as a potential trigger for her vomiting  Dispostion:  After consideration of the diagnostic results and the patients response to treatment, I feel that the patent would benefit from outpatient follow-up.  No evidence of DKA or sepsis.  I do not see an  indication for hospitalization.  The patient was easily tolerating p.o. and wanting to go home at this time.  We have elected to treat with a prolonged course of antibiotics for UTI, possible kidney infection, though it is not clear from this presentation.  She received Rocephin in the ED and will go home on an extended course of cephalexin.         Final Clinical Impression(s) / ED Diagnoses Final diagnoses:  Urinary tract infection without hematuria, site unspecified  Acute gastritis without hemorrhage, unspecified gastritis type    Rx / DC Orders ED Discharge Orders          Ordered    cephALEXin (KEFLEX) 500 MG capsule  2 times daily        04/10/22 1145    ondansetron (ZOFRAN) 4 MG tablet  Every 6 hours        04/10/22 1145              Wyvonnia Dusky, MD 04/10/22 1327

## 2022-04-11 ENCOUNTER — Other Ambulatory Visit (HOSPITAL_COMMUNITY): Payer: Self-pay

## 2022-04-11 MED ORDER — TIRZEPATIDE 7.5 MG/0.5ML ~~LOC~~ SOAJ
7.5000 mg | SUBCUTANEOUS | 1 refills | Status: DC
  Filled 2022-04-11: qty 2, 28d supply, fill #0
  Filled 2022-05-15: qty 2, 28d supply, fill #1
  Filled 2022-06-07: qty 2, 28d supply, fill #2
  Filled 2022-07-10: qty 2, 28d supply, fill #3
  Filled 2022-08-07: qty 2, 28d supply, fill #4
  Filled 2022-09-14: qty 2, 28d supply, fill #5

## 2022-04-12 ENCOUNTER — Other Ambulatory Visit (HOSPITAL_COMMUNITY): Payer: Self-pay

## 2022-04-12 LAB — URINE CULTURE: Culture: >=100000 — AB

## 2022-04-13 ENCOUNTER — Telehealth (HOSPITAL_BASED_OUTPATIENT_CLINIC_OR_DEPARTMENT_OTHER): Payer: Self-pay

## 2022-04-13 NOTE — Telephone Encounter (Signed)
Post ED Visit - Positive Culture Follow-up  Culture report reviewed by antimicrobial stewardship pharmacist: [facility] [x]  Jeneen Rinks, Pharm.D. []  Heide Guile, Pharm.D., BCPS AQ-ID []  Parks Neptune, Pharm.D., BCPS []  Alycia Rossetti, Pharm.D., BCPS []  [place], Pharm.D., BCPS, AAHIVP []  Legrand Como, Pharm.D., BCPS, AAHIVP []  Salome Arnt, PharmD, BCPS []  Johnnette Gourd, PharmD, BCPS []  Hughes Better, PharmD, BCPS []  Leeroy Cha, PharmD []  Laqueta Linden, PharmD, BCPS []  Albertina Parr, PharmD  [place] Team []  Leodis Sias, PharmD []  Lindell Spar, PharmD []  Royetta Asal, PharmD []  Graylin Shiver, Rph []  Rema Fendt) Glennon Mac, PharmD []  Arlyn Dunning, PharmD []  Netta Cedars, PharmD []  Dia Sitter, PharmD []  Leone Haven, PharmD []  Gretta Arab, PharmD []  Theodis Shove, PharmD []  Peggyann Juba, PharmD []  Reuel Boom, PharmD   Positive urine culture Treated with Cephalexin, organism sensitive to the same and no further patient follow-up is required at this time.  Glennon Hamilton 04/13/2022, 11:06 AM

## 2022-05-15 ENCOUNTER — Other Ambulatory Visit (HOSPITAL_COMMUNITY): Payer: Self-pay

## 2022-05-15 MED ORDER — LOSARTAN POTASSIUM-HCTZ 50-12.5 MG PO TABS
1.0000 | ORAL_TABLET | Freq: Every day | ORAL | 2 refills | Status: AC
  Filled 2022-05-15: qty 90, 90d supply, fill #0
  Filled 2022-08-07: qty 90, 90d supply, fill #1
  Filled 2023-02-05: qty 90, 90d supply, fill #2

## 2022-05-18 ENCOUNTER — Other Ambulatory Visit (HOSPITAL_COMMUNITY): Payer: Self-pay

## 2022-05-18 MED ORDER — GABAPENTIN 300 MG PO CAPS
600.0000 mg | ORAL_CAPSULE | Freq: Every day | ORAL | 1 refills | Status: DC
  Filled 2022-05-18: qty 180, 90d supply, fill #0
  Filled 2022-08-16: qty 180, 90d supply, fill #1

## 2022-06-07 ENCOUNTER — Other Ambulatory Visit (HOSPITAL_COMMUNITY): Payer: Self-pay

## 2022-06-13 ENCOUNTER — Other Ambulatory Visit (HOSPITAL_COMMUNITY): Payer: Self-pay

## 2022-07-10 ENCOUNTER — Other Ambulatory Visit (HOSPITAL_COMMUNITY): Payer: Self-pay

## 2022-08-07 ENCOUNTER — Other Ambulatory Visit (HOSPITAL_COMMUNITY): Payer: Self-pay

## 2022-08-08 ENCOUNTER — Other Ambulatory Visit (HOSPITAL_COMMUNITY): Payer: Self-pay

## 2022-08-08 ENCOUNTER — Other Ambulatory Visit: Payer: Self-pay

## 2022-08-08 MED ORDER — IBUPROFEN 800 MG PO TABS
800.0000 mg | ORAL_TABLET | Freq: Three times a day (TID) | ORAL | 0 refills | Status: AC | PRN
  Filled 2022-08-08: qty 60, 20d supply, fill #0

## 2022-08-10 ENCOUNTER — Other Ambulatory Visit: Payer: Self-pay

## 2022-08-16 ENCOUNTER — Other Ambulatory Visit (HOSPITAL_COMMUNITY): Payer: Self-pay

## 2022-09-14 ENCOUNTER — Other Ambulatory Visit (HOSPITAL_COMMUNITY): Payer: Self-pay

## 2022-09-15 ENCOUNTER — Other Ambulatory Visit (HOSPITAL_COMMUNITY): Payer: Self-pay

## 2022-10-19 ENCOUNTER — Other Ambulatory Visit (HOSPITAL_COMMUNITY): Payer: Self-pay

## 2022-10-19 DIAGNOSIS — R634 Abnormal weight loss: Secondary | ICD-10-CM | POA: Diagnosis not present

## 2022-10-19 DIAGNOSIS — Z79899 Other long term (current) drug therapy: Secondary | ICD-10-CM | POA: Diagnosis not present

## 2022-10-19 DIAGNOSIS — Z6827 Body mass index (BMI) 27.0-27.9, adult: Secondary | ICD-10-CM | POA: Diagnosis not present

## 2022-10-19 DIAGNOSIS — I10 Essential (primary) hypertension: Secondary | ICD-10-CM | POA: Diagnosis not present

## 2022-10-19 DIAGNOSIS — E1142 Type 2 diabetes mellitus with diabetic polyneuropathy: Secondary | ICD-10-CM | POA: Diagnosis not present

## 2022-10-19 DIAGNOSIS — Z0000 Encounter for general adult medical examination without abnormal findings: Secondary | ICD-10-CM | POA: Diagnosis not present

## 2022-10-19 DIAGNOSIS — E782 Mixed hyperlipidemia: Secondary | ICD-10-CM | POA: Diagnosis not present

## 2022-10-19 DIAGNOSIS — Z1159 Encounter for screening for other viral diseases: Secondary | ICD-10-CM | POA: Diagnosis not present

## 2022-10-19 DIAGNOSIS — Z7985 Long-term (current) use of injectable non-insulin antidiabetic drugs: Secondary | ICD-10-CM | POA: Diagnosis not present

## 2022-10-19 DIAGNOSIS — G4700 Insomnia, unspecified: Secondary | ICD-10-CM | POA: Diagnosis not present

## 2022-10-19 DIAGNOSIS — F17210 Nicotine dependence, cigarettes, uncomplicated: Secondary | ICD-10-CM | POA: Diagnosis not present

## 2022-10-19 MED ORDER — ROSUVASTATIN CALCIUM 40 MG PO TABS
40.0000 mg | ORAL_TABLET | Freq: Every day | ORAL | 2 refills | Status: AC
  Filled 2022-10-19: qty 90, 90d supply, fill #0
  Filled 2023-02-05: qty 90, 90d supply, fill #1

## 2022-10-19 MED ORDER — HYDROXYZINE PAMOATE 25 MG PO CAPS
25.0000 mg | ORAL_CAPSULE | Freq: Every day | ORAL | 0 refills | Status: DC
  Filled 2022-10-19: qty 30, 30d supply, fill #0

## 2022-10-19 MED ORDER — MOUNJARO 5 MG/0.5ML ~~LOC~~ SOAJ
5.0000 mg | SUBCUTANEOUS | 0 refills | Status: DC
  Filled 2022-10-19: qty 2, 28d supply, fill #0

## 2022-10-19 MED ORDER — LOSARTAN POTASSIUM-HCTZ 50-12.5 MG PO TABS
1.0000 | ORAL_TABLET | Freq: Every day | ORAL | 2 refills | Status: DC
  Filled 2022-10-19: qty 90, 90d supply, fill #0

## 2022-10-19 MED ORDER — GABAPENTIN 300 MG PO CAPS
600.0000 mg | ORAL_CAPSULE | Freq: Every day | ORAL | 1 refills | Status: AC
  Filled 2022-10-19 – 2022-11-24 (×2): qty 180, 90d supply, fill #0
  Filled 2023-03-26: qty 180, 90d supply, fill #1

## 2022-11-06 DIAGNOSIS — Z1231 Encounter for screening mammogram for malignant neoplasm of breast: Secondary | ICD-10-CM | POA: Diagnosis not present

## 2022-11-24 ENCOUNTER — Other Ambulatory Visit (HOSPITAL_COMMUNITY): Payer: Self-pay

## 2022-11-24 MED ORDER — TIRZEPATIDE 5 MG/0.5ML ~~LOC~~ SOAJ
5.0000 mg | SUBCUTANEOUS | 0 refills | Status: DC
  Filled 2022-11-24: qty 2, 28d supply, fill #0

## 2022-11-24 MED ORDER — HYDROXYZINE PAMOATE 25 MG PO CAPS
25.0000 mg | ORAL_CAPSULE | Freq: Every day | ORAL | 0 refills | Status: DC
  Filled 2022-11-24: qty 30, 30d supply, fill #0

## 2022-11-28 ENCOUNTER — Other Ambulatory Visit (HOSPITAL_COMMUNITY): Payer: Self-pay

## 2023-01-03 ENCOUNTER — Other Ambulatory Visit (HOSPITAL_COMMUNITY): Payer: Self-pay

## 2023-01-05 ENCOUNTER — Other Ambulatory Visit (HOSPITAL_COMMUNITY): Payer: Self-pay

## 2023-01-10 ENCOUNTER — Other Ambulatory Visit (HOSPITAL_COMMUNITY): Payer: Self-pay

## 2023-01-10 MED ORDER — MOUNJARO 5 MG/0.5ML ~~LOC~~ SOAJ
5.0000 mg | SUBCUTANEOUS | 0 refills | Status: DC
  Filled 2023-01-10: qty 2, 28d supply, fill #0

## 2023-01-30 ENCOUNTER — Other Ambulatory Visit (HOSPITAL_COMMUNITY): Payer: Self-pay

## 2023-02-05 ENCOUNTER — Other Ambulatory Visit (HOSPITAL_COMMUNITY): Payer: Self-pay

## 2023-02-05 MED ORDER — TIRZEPATIDE 5 MG/0.5ML ~~LOC~~ SOAJ
5.0000 mg | SUBCUTANEOUS | 0 refills | Status: DC
  Filled 2023-02-05: qty 2, 28d supply, fill #0

## 2023-02-06 ENCOUNTER — Other Ambulatory Visit (HOSPITAL_COMMUNITY): Payer: Self-pay

## 2023-02-06 DIAGNOSIS — E1142 Type 2 diabetes mellitus with diabetic polyneuropathy: Secondary | ICD-10-CM | POA: Diagnosis not present

## 2023-02-06 DIAGNOSIS — I10 Essential (primary) hypertension: Secondary | ICD-10-CM | POA: Diagnosis not present

## 2023-02-06 MED ORDER — ZOLPIDEM TARTRATE 5 MG PO TABS
5.0000 mg | ORAL_TABLET | Freq: Every evening | ORAL | 0 refills | Status: DC | PRN
  Filled 2023-02-06: qty 20, 20d supply, fill #0

## 2023-02-06 MED ORDER — MOUNJARO 7.5 MG/0.5ML ~~LOC~~ SOAJ
7.5000 mg | SUBCUTANEOUS | 0 refills | Status: DC
  Filled 2023-02-06: qty 2, 28d supply, fill #0

## 2023-02-10 ENCOUNTER — Encounter (HOSPITAL_BASED_OUTPATIENT_CLINIC_OR_DEPARTMENT_OTHER): Payer: Self-pay | Admitting: Emergency Medicine

## 2023-02-10 DIAGNOSIS — I10 Essential (primary) hypertension: Secondary | ICD-10-CM | POA: Diagnosis not present

## 2023-02-10 DIAGNOSIS — Z794 Long term (current) use of insulin: Secondary | ICD-10-CM | POA: Insufficient documentation

## 2023-02-10 DIAGNOSIS — Z7984 Long term (current) use of oral hypoglycemic drugs: Secondary | ICD-10-CM | POA: Insufficient documentation

## 2023-02-10 DIAGNOSIS — E119 Type 2 diabetes mellitus without complications: Secondary | ICD-10-CM | POA: Insufficient documentation

## 2023-02-10 DIAGNOSIS — R112 Nausea with vomiting, unspecified: Secondary | ICD-10-CM | POA: Insufficient documentation

## 2023-02-10 DIAGNOSIS — R1013 Epigastric pain: Secondary | ICD-10-CM | POA: Diagnosis not present

## 2023-02-10 DIAGNOSIS — Z79899 Other long term (current) drug therapy: Secondary | ICD-10-CM | POA: Diagnosis not present

## 2023-02-10 LAB — CBG MONITORING, ED: Glucose-Capillary: 106 mg/dL — ABNORMAL HIGH (ref 70–99)

## 2023-02-10 MED ORDER — ONDANSETRON 4 MG PO TBDP
4.0000 mg | ORAL_TABLET | Freq: Once | ORAL | Status: AC | PRN
  Administered 2023-02-10: 4 mg via ORAL
  Filled 2023-02-10: qty 1

## 2023-02-10 NOTE — ED Triage Notes (Addendum)
Pt seen by PCP on wed and had mounjaro increased has been sick since with nausea and emesis.

## 2023-02-11 ENCOUNTER — Emergency Department (HOSPITAL_BASED_OUTPATIENT_CLINIC_OR_DEPARTMENT_OTHER)
Admission: EM | Admit: 2023-02-11 | Discharge: 2023-02-11 | Disposition: A | Discharge status: Home / Self Care | Payer: Commercial Managed Care - PPO | Attending: Emergency Medicine | Admitting: Emergency Medicine

## 2023-02-11 DIAGNOSIS — R112 Nausea with vomiting, unspecified: Secondary | ICD-10-CM

## 2023-02-11 LAB — LIPASE, BLOOD: Lipase: 32 U/L (ref 11–51)

## 2023-02-11 LAB — CBC WITH DIFFERENTIAL/PLATELET
Abs Immature Granulocytes: 0.01 10*3/uL (ref 0.00–0.07)
Basophils Absolute: 0 10*3/uL (ref 0.0–0.1)
Basophils Relative: 0 %
Eosinophils Absolute: 0 10*3/uL (ref 0.0–0.5)
Eosinophils Relative: 0 %
HCT: 38.4 % (ref 36.0–46.0)
Hemoglobin: 12.8 g/dL (ref 12.0–15.0)
Immature Granulocytes: 0 %
Lymphocytes Relative: 47 %
Lymphs Abs: 3.5 10*3/uL (ref 0.7–4.0)
MCH: 28 pg (ref 26.0–34.0)
MCHC: 33.3 g/dL (ref 30.0–36.0)
MCV: 84 fL (ref 80.0–100.0)
Monocytes Absolute: 0.6 10*3/uL (ref 0.1–1.0)
Monocytes Relative: 8 %
Neutro Abs: 3.4 10*3/uL (ref 1.7–7.7)
Neutrophils Relative %: 45 %
Platelets: 333 10*3/uL (ref 150–400)
RBC: 4.57 MIL/uL (ref 3.87–5.11)
RDW: 13.8 % (ref 11.5–15.5)
WBC: 7.6 10*3/uL (ref 4.0–10.5)
nRBC: 0 % (ref 0.0–0.2)

## 2023-02-11 LAB — COMPREHENSIVE METABOLIC PANEL
ALT: 17 U/L (ref 0–44)
AST: 26 U/L (ref 15–41)
Albumin: 4.1 g/dL (ref 3.5–5.0)
Alkaline Phosphatase: 49 U/L (ref 38–126)
Anion gap: 8 (ref 5–15)
BUN: 21 mg/dL — ABNORMAL HIGH (ref 6–20)
CO2: 23 mmol/L (ref 22–32)
Calcium: 9.2 mg/dL (ref 8.9–10.3)
Chloride: 106 mmol/L (ref 98–111)
Creatinine, Ser: 1.31 mg/dL — ABNORMAL HIGH (ref 0.44–1.00)
GFR, Estimated: 51 mL/min — ABNORMAL LOW (ref >60)
Glucose, Bld: 106 mg/dL — ABNORMAL HIGH (ref 70–99)
Potassium: 4 mmol/L (ref 3.5–5.1)
Sodium: 137 mmol/L (ref 135–145)
Total Bilirubin: 0.9 mg/dL (ref 0.0–1.2)
Total Protein: 7.8 g/dL (ref 6.5–8.1)

## 2023-02-11 MED ORDER — ONDANSETRON HCL 4 MG/2ML IJ SOLN
4.0000 mg | Freq: Once | INTRAMUSCULAR | Status: AC
Start: 2023-02-11 — End: 2023-02-11
  Administered 2023-02-11: 4 mg via INTRAVENOUS
  Filled 2023-02-11: qty 2

## 2023-02-11 MED ORDER — ONDANSETRON 8 MG PO TBDP: ORAL_TABLET | ORAL | 0 refills | Status: AC

## 2023-02-11 MED ORDER — KETOROLAC TROMETHAMINE 30 MG/ML IJ SOLN
30.0000 mg | Freq: Once | INTRAMUSCULAR | Status: AC
  Administered 2023-02-11: 30 mg via INTRAVENOUS
  Filled 2023-02-11: qty 1

## 2023-02-11 MED ORDER — ONDANSETRON HCL 4 MG/2ML IJ SOLN
4.0000 mg | Freq: Once | INTRAMUSCULAR | Status: AC
  Administered 2023-02-11: 4 mg via INTRAVENOUS
  Filled 2023-02-11: qty 2

## 2023-02-11 MED ORDER — SODIUM CHLORIDE 0.9 % IV BOLUS
1000.0000 mL | Freq: Once | INTRAVENOUS | Status: AC
  Administered 2023-02-11: 1000 mL via INTRAVENOUS

## 2023-02-11 NOTE — Discharge Instructions (Signed)
Begin taking Zofran as prescribed as needed for nausea.  Follow-up with your primary doctor this week regarding your medications, and return to the ER if you develop severe abdominal pain, high fevers, bloody stools, or for other new and concerning symptoms.

## 2023-02-11 NOTE — ED Provider Notes (Signed)
[facility] Provider Note   CSN: [id number] Arrival date & time: 02/10/23  2343     History  Chief Complaint  Patient presents with   Emesis    Traci Butler is a 48 y.o. female.  Patient is a 48 year old female with history of type 2 diabetes, hypertension, hyperlipidemia.  Patient presenting today with complaints of nausea and vomiting.  Earlier this week, her doctor increased her dose of Mounjaro which she takes for her diabetes.  Since that time she has had nausea, episodes of vomiting, and difficulty eating.  She denies fevers or chills.  She denies diarrhea or constipation.  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  The history is provided by the patient.       Home Medications Prior to Admission medications   Medication Sig Start Date End Date Taking? Authorizing Provider  BAYER MICROLET LANCETS lancets Use as instructed two times daily 08/21/16   Tyson Alias, MD  Blood Glucose Monitoring Suppl (CONTOUR NEXT ONE) KIT 1 each by Does not apply route 2 (two) times daily. 08/21/16   Tyson Alias, MD  Blood Glucose Monitoring Suppl (FREESTYLE LITE) w/Device KIT Use as directed. 03/25/21     Continuous Blood Gluc Receiver (DEXCOM G6 RECEIVER) DEVI Use as directed for continuous glucose monitoring. 01/05/21     Continuous Blood Gluc Sensor (DEXCOM G6 SENSOR) MISC Inject 1 sensor to the skin every 10 days for continuous glucose monitoring. 01/05/21     Continuous Blood Gluc Sensor (FREESTYLE LIBRE 2 SENSOR) MISC Apply 1 sensor to the skin every 14 days for continuous glucose monitoring 10/21/20     Continuous Blood Gluc Sensor (FREESTYLE LIBRE 3 SENSOR) MISC Inject 1 sensor to the skin every 14 days for continuous glucose monitoring. 04/20/21     Continuous Blood Gluc Transmit (DEXCOM G6 TRANSMITTER) MISC Use as directed for continuous glucose monitoring. Reuse transmitter for 90  days then discard and replace. 01/05/21     gabapentin (NEURONTIN) 300 MG capsule Take 1 capsule (300 mg total) by mouth at bedtime. 12/25/17   Earl Lagos, MD  gabapentin (NEURONTIN) 300 MG capsule Take 2 capsules (600 mg total) by mouth daily. 04/20/21     gabapentin (NEURONTIN) 300 MG capsule Take 2 capsules (600 mg total) by mouth daily. 10/19/22   Sherrie Mustache T, PA-C  glucose blood (CONTOUR NEXT TEST) test strip Use as instructed two times daily 08/21/16   Tyson Alias, MD  glucose blood (FREESTYLE LITE) test strip Use to check blood sugar twice daily. 03/25/21     ibuprofen (ADVIL) 800 MG tablet Take 1 tablet (800 mg total) by mouth every 8 (eight) hours as needed for pain 08/08/22     Insulin Pen Needle (PEN NEEDLES) 32G X 4 MM MISC 1 each by Does not apply route daily. 10/19/16   Earl Lagos, MD  liraglutide (VICTOZA) 18 MG/3ML SOPN Inject 0.2 mLs (1.2 mg total) into the skin daily. 12/25/17   Earl Lagos, MD  liraglutide (VICTOZA) 18 MG/3ML SOPN Inject 1.2 mg into the skin daily. 10/13/20     losartan-hydrochlorothiazide (HYZAAR) 50-12.5 MG tablet Take 1 tablet by mouth daily. 12/25/17   Earl Lagos, MD  losartan-hydrochlorothiazide (HYZAAR) 50-12.5 MG tablet Take 1 tablet by mouth daily. 04/29/21     losartan-hydrochlorothiazide (HYZAAR) 50-12.5 MG tablet Take 1 tablet by mouth daily. 05/15/22     losartan-hydrochlorothiazide (HYZAAR) 50-12.5 MG tablet Take  1 tablet by mouth daily. 10/19/22     metFORMIN (GLUCOPHAGE) 1000 MG tablet Take 1 tablet (1,000 mg total) by mouth 2 (two) times daily with a meal. 02/19/18   Earl Lagos, MD  metFORMIN (GLUCOPHAGE) 1000 MG tablet Take 1 tablet (1,000 mg total) by mouth 2 (two) times daily with a meal. 10/13/20     ondansetron (ZOFRAN ODT) 4 MG disintegrating tablet Take 1 tablet (4 mg total) by mouth every 8 (eight) hours as needed for nausea or vomiting. 11/24/19   Farrel Gordon, PA-C  rosuvastatin (CRESTOR) 20 MG tablet  TAKE 1 TABLET BY MOUTH EVERY DAY 04/29/18   Earl Lagos, MD  rosuvastatin (CRESTOR) 20 MG tablet Take 1 tablet (20 mg total) by mouth every evening. 01/28/21     rosuvastatin (CRESTOR) 20 MG tablet Take 1 tablet (20 mg total) by mouth daily. 06/23/21     rosuvastatin (CRESTOR) 20 MG tablet Take 1 tablet (20 mg total) by mouth daily. 07/28/21     rosuvastatin (CRESTOR) 20 MG tablet Take 2 tablets (40 mg total) by mouth daily. 07/28/21     rosuvastatin (CRESTOR) 40 MG tablet Take 1 tablet (40 mg total) by mouth daily. 07/28/21     rosuvastatin (CRESTOR) 40 MG tablet Take 1 tablet (40 mg total) by mouth daily. 10/19/22     Semaglutide,0.25 or 0.5MG /DOS, (OZEMPIC, 0.25 OR 0.5 MG/DOSE,) 2 MG/1.5ML SOPN Inject 0.25 mg into the skin once a week. 10/25/20     tirzepatide (MOUNJARO) 10 MG/0.5ML Pen Inject 10 mg into the skin every 7 (seven) days. 10/25/21     tirzepatide (MOUNJARO) 12.5 MG/0.5ML Pen Inject 12.5 mg into the skin every 7 days 10/26/21     tirzepatide [facility]) 2.5 MG/0.5ML Pen Inject 2.5 mg into the skin once a week. 12/06/20     tirzepatide (MOUNJARO) 7.5 MG/0.5ML Pen Inject 7.5 mg into the skin every 7 (seven) days. 02/06/23     zolpidem (AMBIEN) 5 MG tablet Take 1 tablet (5 mg total) by mouth nightly as needed for sleep. 02/06/23         Allergies    Patient has no known allergies.    Review of Systems   Review of Systems  All other systems reviewed and are negative.   Physical Exam Updated Vital Signs BP (!) 123/95   Pulse 92   Temp 98.1 F (36.7 C) (Oral)   Resp 16   Ht 5\' 7"  (1.702 m)   Wt 82.6 kg   SpO2 100%   BMI 28.51 kg/m  Physical Exam Vitals and nursing note reviewed.  Constitutional:      General: She is not in acute distress.    Appearance: She is well-developed. She is not diaphoretic.  HENT:     Head: Normocephalic and atraumatic.  Cardiovascular:     Rate and Rhythm: Normal rate and regular rhythm.     Heart sounds: No murmur heard.    No friction rub. No  gallop.  Pulmonary:     Effort: Pulmonary effort is normal. No respiratory distress.     Breath sounds: Normal breath sounds. No wheezing.  Abdominal:     General: Bowel sounds are normal. There is no distension.     Palpations: Abdomen is soft.     Tenderness: There is abdominal tenderness. There is no right CVA tenderness, left CVA tenderness, guarding or rebound.     Comments: There is mild epigastric tenderness.  Musculoskeletal:        General: Normal range  of motion.     Cervical back: Normal range of motion and neck supple.  Skin:    General: Skin is warm and dry.  Neurological:     General: No focal deficit present.     Mental Status: She is alert and oriented to person, place, and time.     ED Results / Procedures / Treatments   Labs (all labs ordered are listed, but only abnormal results are displayed) Labs Reviewed  CBG MONITORING, ED - Abnormal; Notable for the following components:      Result Value   Glucose-Capillary 106 (*)    All other components within normal limits    EKG None  Radiology No results found.  Procedures Procedures    Medications Ordered in ED Medications  sodium chloride 0.9 % bolus 1,000 mL (has no administration in time range)  ketorolac (TORADOL) 30 MG/ML injection 30 mg (has no administration in time range)  ondansetron (ZOFRAN) injection 4 mg (has no administration in time range)  ondansetron (ZOFRAN-ODT) disintegrating tablet 4 mg (4 mg Oral Given 02/10/23 2358)    ED Course/ Medical Decision Making/ A&P  Patient is a 48 year old female presenting with nausea and vomiting since increasing her dose of Mounjaro.  Patient arrives here with stable vital signs and is afebrile.  She has mild epigastric tenderness, but abdomen is otherwise benign.  Physical examination unremarkable.  Laboratory studies obtained including CBC, CMP, and lipase, all of which are unremarkable.  She has been hydrated with normal saline and given Zofran  for nausea and Toradol for discomfort and seems to be feeling significantly improved.  I doubt an acute intra-abdominal process.  Symptoms likely a side effect of her Mounjaro, but could also be viral or foodborne in nature.  I will discharge the patient with ODT Zofran and follow-up with primary doctor to discuss her medications.  Final Clinical Impression(s) / ED Diagnoses Final diagnoses:  None    Rx / DC Orders ED Discharge Orders     None         Geoffery Lyons, MD 02/11/23 [phone]

## 2023-03-07 DIAGNOSIS — R200 Anesthesia of skin: Secondary | ICD-10-CM | POA: Diagnosis not present

## 2023-03-07 DIAGNOSIS — R202 Paresthesia of skin: Secondary | ICD-10-CM | POA: Diagnosis not present

## 2023-03-26 ENCOUNTER — Other Ambulatory Visit (HOSPITAL_COMMUNITY): Payer: Self-pay

## 2023-03-26 ENCOUNTER — Other Ambulatory Visit: Payer: Self-pay

## 2023-03-26 MED ORDER — TIRZEPATIDE 7.5 MG/0.5ML ~~LOC~~ SOAJ
7.5000 mg | SUBCUTANEOUS | 0 refills | Status: DC
  Filled 2023-03-26: qty 2, 28d supply, fill #0

## 2023-03-26 MED ORDER — ZOLPIDEM TARTRATE 5 MG PO TABS
5.0000 mg | ORAL_TABLET | Freq: Every evening | ORAL | 0 refills | Status: DC | PRN
  Filled 2023-03-26: qty 20, 20d supply, fill #0

## 2023-03-27 ENCOUNTER — Other Ambulatory Visit (HOSPITAL_COMMUNITY): Payer: Self-pay

## 2023-05-14 ENCOUNTER — Other Ambulatory Visit (HOSPITAL_COMMUNITY): Payer: Self-pay

## 2023-05-14 DIAGNOSIS — I10 Essential (primary) hypertension: Secondary | ICD-10-CM | POA: Diagnosis not present

## 2023-05-14 DIAGNOSIS — E1142 Type 2 diabetes mellitus with diabetic polyneuropathy: Secondary | ICD-10-CM | POA: Diagnosis not present

## 2023-05-14 DIAGNOSIS — E782 Mixed hyperlipidemia: Secondary | ICD-10-CM | POA: Diagnosis not present

## 2023-05-14 DIAGNOSIS — G4700 Insomnia, unspecified: Secondary | ICD-10-CM | POA: Diagnosis not present

## 2023-05-14 MED ORDER — ROSUVASTATIN CALCIUM 40 MG PO TABS
40.0000 mg | ORAL_TABLET | Freq: Every day | ORAL | 2 refills | Status: AC
  Filled 2023-05-14: qty 90, 90d supply, fill #0
  Filled 2023-07-26: qty 90, 90d supply, fill #1

## 2023-05-14 MED ORDER — MOUNJARO 7.5 MG/0.5ML ~~LOC~~ SOAJ
7.5000 mg | SUBCUTANEOUS | 3 refills | Status: DC
Start: 2023-05-14 — End: 2023-10-09
  Filled 2023-05-14: qty 2, 28d supply, fill #0
  Filled 2023-06-19: qty 2, 28d supply, fill #1
  Filled 2023-07-26: qty 2, 28d supply, fill #2
  Filled 2023-09-05 (×2): qty 2, 28d supply, fill #3

## 2023-05-14 MED ORDER — LOSARTAN POTASSIUM 50 MG PO TABS
50.0000 mg | ORAL_TABLET | Freq: Every day | ORAL | 1 refills | Status: DC
  Filled 2023-05-14: qty 30, 30d supply, fill #0
  Filled 2023-06-19: qty 30, 30d supply, fill #1

## 2023-05-14 MED ORDER — ZOLPIDEM TARTRATE 10 MG PO TABS
10.0000 mg | ORAL_TABLET | Freq: Every evening | ORAL | 1 refills | Status: AC | PRN
Start: 2023-05-14 — End: ?
  Filled 2023-05-14: qty 30, 30d supply, fill #0
  Filled 2023-06-19: qty 30, 30d supply, fill #1

## 2023-05-14 MED ORDER — IBUPROFEN 800 MG PO TABS
800.0000 mg | ORAL_TABLET | Freq: Three times a day (TID) | ORAL | 0 refills | Status: DC | PRN
  Filled 2023-05-14: qty 30, 10d supply, fill #0

## 2023-05-15 ENCOUNTER — Other Ambulatory Visit (HOSPITAL_COMMUNITY): Payer: Self-pay

## 2023-06-19 ENCOUNTER — Other Ambulatory Visit (HOSPITAL_COMMUNITY): Payer: Self-pay

## 2023-06-19 MED ORDER — IBUPROFEN 800 MG PO TABS
800.0000 mg | ORAL_TABLET | Freq: Three times a day (TID) | ORAL | 0 refills | Status: DC | PRN
  Filled 2023-06-19: qty 30, 10d supply, fill #0

## 2023-06-25 ENCOUNTER — Other Ambulatory Visit (HOSPITAL_COMMUNITY): Payer: Self-pay

## 2023-07-26 ENCOUNTER — Other Ambulatory Visit (HOSPITAL_COMMUNITY): Payer: Self-pay

## 2023-07-26 MED ORDER — LOSARTAN POTASSIUM 50 MG PO TABS
50.0000 mg | ORAL_TABLET | Freq: Every day | ORAL | 1 refills | Status: DC
  Filled 2023-07-26: qty 30, 30d supply, fill #0
  Filled 2023-09-05: qty 30, 30d supply, fill #1

## 2023-07-26 MED ORDER — IBUPROFEN 800 MG PO TABS
800.0000 mg | ORAL_TABLET | Freq: Three times a day (TID) | ORAL | 0 refills | Status: DC | PRN
  Filled 2023-07-26: qty 30, 10d supply, fill #0

## 2023-09-05 ENCOUNTER — Other Ambulatory Visit (HOSPITAL_COMMUNITY): Payer: Self-pay

## 2023-09-05 MED ORDER — IBUPROFEN 800 MG PO TABS
800.0000 mg | ORAL_TABLET | Freq: Three times a day (TID) | ORAL | 0 refills | Status: DC | PRN
  Filled 2023-09-05 (×2): qty 30, 10d supply, fill #0

## 2023-10-09 ENCOUNTER — Other Ambulatory Visit (HOSPITAL_COMMUNITY): Payer: Self-pay

## 2023-10-09 MED ORDER — LOSARTAN POTASSIUM 50 MG PO TABS
50.0000 mg | ORAL_TABLET | Freq: Every day | ORAL | 2 refills | Status: AC
  Filled 2023-10-09: qty 90, 90d supply, fill #0
  Filled 2024-01-21: qty 90, 90d supply, fill #1

## 2023-10-09 MED ORDER — ZOLPIDEM TARTRATE 10 MG PO TABS
10.0000 mg | ORAL_TABLET | Freq: Every evening | ORAL | 2 refills | Status: AC | PRN
  Filled 2023-10-09: qty 30, 30d supply, fill #0
  Filled 2023-12-07: qty 30, 30d supply, fill #1
  Filled 2024-01-21: qty 30, 30d supply, fill #2

## 2023-10-09 MED ORDER — MOUNJARO 7.5 MG/0.5ML ~~LOC~~ SOAJ
7.5000 mg | SUBCUTANEOUS | 3 refills | Status: AC
  Filled 2023-10-09: qty 2, 28d supply, fill #0
  Filled 2023-12-07: qty 2, 28d supply, fill #1
  Filled 2024-01-21: qty 2, 28d supply, fill #2

## 2023-10-09 MED ORDER — ROSUVASTATIN CALCIUM 40 MG PO TABS
40.0000 mg | ORAL_TABLET | Freq: Every day | ORAL | 2 refills | Status: AC
  Filled 2023-10-09: qty 90, 90d supply, fill #0

## 2023-10-10 DIAGNOSIS — E1142 Type 2 diabetes mellitus with diabetic polyneuropathy: Secondary | ICD-10-CM | POA: Diagnosis not present

## 2023-10-10 DIAGNOSIS — H524 Presbyopia: Secondary | ICD-10-CM | POA: Diagnosis not present

## 2023-10-10 DIAGNOSIS — H52223 Regular astigmatism, bilateral: Secondary | ICD-10-CM | POA: Diagnosis not present

## 2023-10-10 DIAGNOSIS — H5213 Myopia, bilateral: Secondary | ICD-10-CM | POA: Diagnosis not present

## 2023-10-12 ENCOUNTER — Other Ambulatory Visit (HOSPITAL_COMMUNITY): Payer: Self-pay

## 2023-12-07 ENCOUNTER — Other Ambulatory Visit (HOSPITAL_COMMUNITY): Payer: Self-pay

## 2023-12-07 MED ORDER — IBUPROFEN 800 MG PO TABS
800.0000 mg | ORAL_TABLET | Freq: Three times a day (TID) | ORAL | 0 refills | Status: DC | PRN
  Filled 2023-12-07: qty 30, 10d supply, fill #0

## 2024-01-21 ENCOUNTER — Other Ambulatory Visit (HOSPITAL_COMMUNITY): Payer: Self-pay

## 2024-01-21 MED ORDER — IBUPROFEN 800 MG PO TABS
800.0000 mg | ORAL_TABLET | Freq: Three times a day (TID) | ORAL | 0 refills | Status: AC | PRN
  Filled 2024-01-21: qty 30, 10d supply, fill #0
